# Patient Record
Sex: Male | Born: 1955 | Race: White | Hispanic: No | Marital: Married | Smoking: Current every day smoker
Health system: Southern US, Community
[De-identification: ages and names within clinical notes are randomized; demographics above are authoritative.]

## PROBLEM LIST (undated history)

## (undated) DIAGNOSIS — J449 Chronic obstructive pulmonary disease, unspecified: Secondary | ICD-10-CM

## (undated) DIAGNOSIS — R569 Unspecified convulsions: Secondary | ICD-10-CM

## (undated) HISTORY — PX: LUMBAR DISC SURGERY: SHX700

## (undated) HISTORY — DX: Unspecified convulsions: R56.9

## (undated) HISTORY — PX: TONSILLECTOMY: SUR1361

---

## 2014-10-20 ENCOUNTER — Other Ambulatory Visit: Payer: Self-pay | Admitting: Family Medicine

## 2014-10-20 ENCOUNTER — Telehealth: Payer: Self-pay | Admitting: Family Medicine

## 2014-10-20 DIAGNOSIS — G40802 Other epilepsy, not intractable, without status epilepticus: Secondary | ICD-10-CM

## 2014-10-20 MED ORDER — PHENYTOIN SODIUM EXTENDED 200 MG PO CAPS: 200.0000 mg | ORAL_CAPSULE | Freq: Two times a day (BID) | ORAL | Status: DC

## 2014-10-20 NOTE — Telephone Encounter (Signed)
Routed to Dr. Shah °

## 2014-10-20 NOTE — Telephone Encounter (Signed)
Scheduled annual cpe for November 10, 2014. He is requesting a refill on Phenytoin ER 100mg . Would like for a 30day supply to be sent to [facility] and then the remaining to be sent to [facility] delivery (p) [phone] option 3. Patient is completely out. Please call patient once this is complete [phone]

## 2014-10-20 NOTE — Telephone Encounter (Signed)
Prescription for phenytoin sent to pharmacy

## 2014-10-20 NOTE — Telephone Encounter (Signed)
Patient is going to call [facility] would not take the phone call. [employer] told patient that we had to call them and give them the new fax number

## 2014-10-20 NOTE — Telephone Encounter (Signed)
Cassandra,  Please have the pt contact his pharmacies  to send [place] a refill request.

## 2014-11-10 ENCOUNTER — Ambulatory Visit (INDEPENDENT_AMBULATORY_CARE_PROVIDER_SITE_OTHER): Payer: Managed Care, Other (non HMO) | Admitting: Family Medicine

## 2014-11-10 ENCOUNTER — Encounter: Payer: Self-pay | Admitting: Family Medicine

## 2014-11-10 VITALS — BP 112/75 | HR 65 | Temp 98.8°F | Resp 18 | Ht 66.0 in | Wt 154.4 lb

## 2014-11-10 DIAGNOSIS — G40409 Other generalized epilepsy and epileptic syndromes, not intractable, without status epilepticus: Secondary | ICD-10-CM | POA: Diagnosis not present

## 2014-11-10 DIAGNOSIS — Z0000 Encounter for general adult medical examination without abnormal findings: Secondary | ICD-10-CM

## 2014-11-10 DIAGNOSIS — R0683 Snoring: Secondary | ICD-10-CM | POA: Diagnosis not present

## 2014-11-10 MED ORDER — PHENYTOIN SODIUM EXTENDED 100 MG PO CAPS: 200.0000 mg | ORAL_CAPSULE | Freq: Two times a day (BID) | ORAL | Status: DC

## 2014-11-10 NOTE — Progress Notes (Signed)
Name: Nathan Mack   MRN: [id number]    DOB: [birth year]   Date:11/10/2014       Progress Note  Subjective  Chief Complaint  Chief Complaint  Patient presents with  . Annual Exam    CPE    HPI Pt. Is here for a CPE. Overall he is feeling well but his wife has noticed snoring.  Past Medical History  Diagnosis Date  . Seizures     Past Surgical History  Procedure Laterality Date  . Lumbar disc surgery      Family History  Problem Relation Age of Onset  . Cancer Mother     History   Social History  . Marital Status: Married    Spouse Name: N/A  . Number of Children: N/A  . Years of Education: N/A   Occupational History  . Not on file.   Social History Main Topics  . Smoking status: Current Every Day Smoker -- 1.00 packs/day    Types: Cigarettes  . Smokeless tobacco: Never Used  . Alcohol Use: No  . Drug Use: No  . Sexual Activity: Not on file   Other Topics Concern  . Not on file   Social History Narrative  . No narrative on file     Current outpatient prescriptions:  .  CALCIUM CARBONATE-VIT D-MIN PO, Take by mouth., Disp: , Rfl:  .  folic acid (FOLVITE) 366 MCG tablet, Take by mouth., Disp: , Rfl:  .  phenytoin (DILANTIN) 200 MG ER capsule, Take 1 capsule (200 mg total) by mouth 2 (two) times daily., Disp: 60 capsule, Rfl: 0  Allergies  Allergen Reactions  . Codeine   . Hydromorphone      Review of Systems  Constitutional: Negative for fever, chills, weight loss and malaise/fatigue.  HENT: Negative for congestion, ear pain and sore throat.   Eyes: Negative for blurred vision and pain.  Respiratory: Negative for cough, sputum production and shortness of breath.   Cardiovascular: Negative for chest pain, palpitations and leg swelling.  Gastrointestinal: Negative for heartburn, vomiting, diarrhea, constipation and blood in stool.  Genitourinary: Negative for dysuria, frequency and hematuria.  Musculoskeletal: Negative for myalgias, back pain and  joint pain.  Skin: Negative for itching and rash.  Neurological: Positive for seizures (seizure free on medication.). Negative for dizziness, tremors, loss of consciousness and headaches.  Endo/Heme/Allergies: Negative for polydipsia. Does not bruise/bleed easily.  Psychiatric/Behavioral: Negative for depression and memory loss. The patient is not nervous/anxious and does not have insomnia.       Objective  Filed Vitals:   11/10/14 1526  BP: 112/75  Pulse: 65  Temp: 98.8 F (37.1 C)  TempSrc: Oral  Resp: 18  Height: 5\' 6"  (1.676 m)  Weight: 154 lb 6.4 oz (70.035 kg)  SpO2: 97%    Physical Exam  Constitutional: He is oriented to person, place, and time and well-developed, well-nourished, and in no distress.  HENT:  Head: Normocephalic and atraumatic.  Right Ear: External ear normal.  Left Ear: External ear normal.  Mouth/Throat: No posterior oropharyngeal edema or posterior oropharyngeal erythema.  Right ear canal has cerumen build up.  Eyes:    Right pupil is not round, history of injury and surgery to the eye  Neck: Normal range of motion. Neck supple. No thyroid mass present.  Cardiovascular: Normal rate and regular rhythm.   Pulmonary/Chest: Effort normal and breath sounds normal.  Abdominal: Soft. Bowel sounds are normal.  Genitourinary:  Deferred on pt. Request.  Musculoskeletal: He  exhibits edema.       Right ankle: He exhibits swelling.       Left ankle: He exhibits swelling.  Neurological: He is alert and oriented to person, place, and time.  Skin: Skin is warm, dry and intact.  Psychiatric: Mood, memory, affect and judgment normal.  Nursing note and vitals reviewed.     Assessment & Plan 1. Encounter for annual health examination Patient reportedly had a colonoscopy in 2008 in [place]. No results or reports are available. He will be referred to gastroenterology for further evaluation and consideration for repeat colonoscopy  - CBC with  Differential - Comprehensive Metabolic Panel (CMET) - Lipid Profile - PSA - TSH - Hemoglobin A1c - Ambulatory referral to Gastroenterology - Vitamin D (25 hydroxy)   2. Seizure disorder, grand mal - Dilantin (Phenytoin) level, total - Folate - phenytoin (DILANTIN) 100 MG ER capsule; Take 2 capsules (200 mg total) by mouth 2 (two) times daily.  Dispense: 360 capsule; Refill: 1  3. Snoring - Home sleep test; Future   Myiah Petkus [facility] 11/10/2014 3:59 PM

## 2014-11-16 LAB — LIPID PANEL
Chol/HDL Ratio: 3.1 ratio units (ref 0.0–5.0)
Cholesterol, Total: 212 mg/dL — ABNORMAL HIGH (ref 100–199)
HDL: 68 mg/dL (ref >39)
LDL CALC: 126 mg/dL — AB (ref 0–99)
Triglycerides: 88 mg/dL (ref 0–149)
VLDL Cholesterol Cal: 18 mg/dL (ref 5–40)

## 2014-11-16 LAB — HEMOGLOBIN A1C
ESTIMATED AVERAGE GLUCOSE: 111 mg/dL
Hgb A1c MFr Bld: 5.5 % (ref 4.8–5.6)

## 2014-11-16 LAB — COMPREHENSIVE METABOLIC PANEL
ALBUMIN: 4.3 g/dL (ref 3.5–5.5)
ALK PHOS: 105 IU/L (ref 39–117)
ALT: 17 IU/L (ref 0–44)
AST: 18 IU/L (ref 0–40)
Albumin/Globulin Ratio: 1.4 (ref 1.1–2.5)
BUN/Creatinine Ratio: 19 (ref 9–20)
BUN: 16 mg/dL (ref 6–24)
Bilirubin Total: 0.4 mg/dL (ref 0.0–1.2)
CO2: 21 mmol/L (ref 18–29)
Calcium: 9.7 mg/dL (ref 8.7–10.2)
Chloride: 103 mmol/L (ref 97–108)
Creatinine, Ser: 0.85 mg/dL (ref 0.76–1.27)
GFR calc Af Amer: 110 mL/min/{1.73_m2} (ref >59)
GFR, EST NON AFRICAN AMERICAN: 95 mL/min/{1.73_m2} (ref >59)
GLOBULIN, TOTAL: 3 g/dL (ref 1.5–4.5)
GLUCOSE: 90 mg/dL (ref 65–99)
Potassium: 4.6 mmol/L (ref 3.5–5.2)
SODIUM: 142 mmol/L (ref 134–144)
Total Protein: 7.3 g/dL (ref 6.0–8.5)

## 2014-11-16 LAB — CBC WITH DIFFERENTIAL/PLATELET
BASOS: 1 %
Basophils Absolute: 0.1 10*3/uL (ref 0.0–0.2)
EOS (ABSOLUTE): 0.3 10*3/uL (ref 0.0–0.4)
Eos: 5 %
HEMOGLOBIN: 13.8 g/dL (ref 12.6–17.7)
Hematocrit: 39.8 % (ref 37.5–51.0)
Immature Grans (Abs): 0 10*3/uL (ref 0.0–0.1)
Immature Granulocytes: 0 %
LYMPHS ABS: 2.7 10*3/uL (ref 0.7–3.1)
Lymphs: 46 %
MCH: 33.3 pg — ABNORMAL HIGH (ref 26.6–33.0)
MCHC: 34.7 g/dL (ref 31.5–35.7)
MCV: 96 fL (ref 79–97)
Monocytes Absolute: 0.4 10*3/uL (ref 0.1–0.9)
Monocytes: 6 %
NEUTROS ABS: 2.5 10*3/uL (ref 1.4–7.0)
Neutrophils: 42 %
Platelets: 287 10*3/uL (ref 150–379)
RBC: 4.15 x10E6/uL (ref 4.14–5.80)
RDW: 14.4 % (ref 12.3–15.4)
WBC: 5.9 10*3/uL (ref 3.4–10.8)

## 2014-11-16 LAB — PSA: PROSTATE SPECIFIC AG, SERUM: 0.3 ng/mL (ref 0.0–4.0)

## 2014-11-16 LAB — PHENYTOIN LEVEL, TOTAL: Phenytoin (Dilantin), Serum: 22.5 ug/mL (ref 10.0–20.0)

## 2014-11-16 LAB — VITAMIN D 25 HYDROXY (VIT D DEFICIENCY, FRACTURES): Vit D, 25-Hydroxy: 42.6 ng/mL (ref 30.0–100.0)

## 2014-11-16 LAB — FOLATE: Folate: >20 ng/mL (ref >3.0)

## 2014-11-16 LAB — TSH: TSH: 1.67 u[IU]/mL (ref 0.450–4.500)

## 2014-11-17 ENCOUNTER — Other Ambulatory Visit: Payer: Self-pay | Admitting: Family Medicine

## 2014-11-17 DIAGNOSIS — G40409 Other generalized epilepsy and epileptic syndromes, not intractable, without status epilepticus: Secondary | ICD-10-CM

## 2014-11-18 LAB — PHENYTOIN LEVEL, TOTAL: Phenytoin (Dilantin), Serum: 24.1 ug/mL (ref 10.0–20.0)

## 2014-11-22 ENCOUNTER — Telehealth: Payer: Self-pay

## 2014-11-22 NOTE — Telephone Encounter (Signed)
We have tried to call patient several times with no voicemail, we will send letter for him to call us back

## 2014-11-25 ENCOUNTER — Telehealth: Payer: Self-pay | Admitting: Family Medicine

## 2014-11-25 DIAGNOSIS — G40409 Other generalized epilepsy and epileptic syndromes, not intractable, without status epilepticus: Secondary | ICD-10-CM

## 2014-11-25 NOTE — Telephone Encounter (Signed)
Lab orders for Phenytoin levels recheck has been ordered and patient has picked up orders and went to lab

## 2014-11-26 LAB — PHENYTOIN LEVEL, TOTAL: Phenytoin (Dilantin), Serum: 15.2 ug/mL (ref 10.0–20.0)

## 2015-02-10 ENCOUNTER — Ambulatory Visit: Payer: Managed Care, Other (non HMO) | Admitting: Family Medicine

## 2015-02-14 ENCOUNTER — Ambulatory Visit: Payer: Managed Care, Other (non HMO) | Admitting: Family Medicine

## 2015-02-16 ENCOUNTER — Ambulatory Visit (INDEPENDENT_AMBULATORY_CARE_PROVIDER_SITE_OTHER): Payer: Managed Care, Other (non HMO) | Admitting: Family Medicine

## 2015-02-16 ENCOUNTER — Encounter: Payer: Self-pay | Admitting: Family Medicine

## 2015-02-16 VITALS — BP 124/62 | HR 71 | Temp 98.7°F | Resp 16 | Ht 66.0 in | Wt 148.2 lb

## 2015-02-16 DIAGNOSIS — G40409 Other generalized epilepsy and epileptic syndromes, not intractable, without status epilepticus: Secondary | ICD-10-CM | POA: Diagnosis not present

## 2015-02-16 DIAGNOSIS — Z716 Tobacco abuse counseling: Secondary | ICD-10-CM | POA: Diagnosis not present

## 2015-02-16 DIAGNOSIS — Z23 Encounter for immunization: Secondary | ICD-10-CM | POA: Diagnosis not present

## 2015-02-16 NOTE — Progress Notes (Signed)
Name: Nathan Mack   MRN: [id number]    DOB: October 22, 1955   Date:02/16/2015       Progress Note  Subjective  Chief Complaint  Chief Complaint  Patient presents with  . Medication Refill    follow-up  . Seizures    her for re check on dilantin levels  . Nicotine Dependence    has cut back smoking to a half pack and has started walking    Nicotine Dependence Presents for follow-up visit. Preferred tobacco types include cigarettes. Preferred cigarette types include filtered. His urge triggers include company of smokers. Past treatments include nothing. Tatsuya is thinking about quitting (Patient has cut down from 1 pack a day to half pack a day).   Seizures Pt. Has history of epilepsy, last seizure was many years ago. Currently on Dilantin 200 mg twice daily. Last Dilantin level obtained in August 2016 was within normal range.  Past Medical History  Diagnosis Date  . Seizures [facility])     Past Surgical History  Procedure Laterality Date  . Lumbar disc surgery      Family History  Problem Relation Age of Onset  . Cancer Mother     Social History   Social History  . Marital Status: Married    Spouse Name: N/A  . Number of Children: N/A  . Years of Education: N/A   Occupational History  . Not on file.   Social History Main Topics  . Smoking status: Current Every Day Smoker -- 0.50 packs/day    Types: Cigarettes  . Smokeless tobacco: Never Used  . Alcohol Use: No  . Drug Use: No  . Sexual Activity: Not on file   Other Topics Concern  . Not on file   Social History Narrative    Current outpatient prescriptions:  .  CALCIUM CARBONATE-VIT D-MIN PO, Take by mouth., Disp: , Rfl:  .  folic acid (FOLVITE) 712 MCG tablet, Take by mouth., Disp: , Rfl:  .  phenytoin (DILANTIN) 100 MG ER capsule, Take 2 capsules (200 mg total) by mouth 2 (two) times daily., Disp: 360 capsule, Rfl: 1  Allergies  Allergen Reactions  . Codeine   . Hydromorphone     Review of Systems   Neurological: Negative for dizziness, seizures, loss of consciousness and headaches.    Objective  Filed Vitals:   02/16/15 1431  BP: 124/62  Pulse: 71  Temp: 98.7 F (37.1 C)  TempSrc: Oral  Resp: 16  Height: 5\' 6"  (1.676 m)  Weight: 148 lb 3.2 oz (67.223 kg)  SpO2: 98%    Physical Exam  Constitutional: He is oriented to person, place, and time and well-developed, well-nourished, and in no distress.  Cardiovascular: Normal rate, regular rhythm and normal heart sounds.   Pulmonary/Chest: Effort normal and breath sounds normal.  Neurological: He is alert and oriented to person, place, and time.  Nursing note and vitals reviewed.     Recent Results (from the past 2160 hour(s))  Dilantin (Phenytoin) level, total     Status: None   Collection Time: 11/25/14  2:03 PM  Result Value Ref Range   Phenytoin (Dilantin), Serum 15.2 10.0 - 20.0 ug/mL    Comment:                                 Neonatal:  Therapeutic 6.0 - 14.0                                 Detection Limit =  0.8                           <0.8 Indicates None Detected      Assessment & Plan  1. Needs flu shot  - Flu Vaccine QUAD 36+ mos PF IM (Fluarix & Fluzone Quad PF)  2. Seizure disorder, grand mal (HCC) Obtain phenytoin levels. Patient continues on present dosage of Dilantin. No seizures reported. - Dilantin (Phenytoin) level, total  3. Tobacco abuse counseling  Encouraged patient to stop smoking completely. He is not interested in any pharmacotherapy at present.    Media Pizzini [facility] 02/16/2015 2:59 PM

## 2015-04-28 LAB — PHENYTOIN LEVEL, TOTAL: PHENYTOIN (DILANTIN), SERUM: 11.5 ug/mL (ref 10.0–20.0)

## 2015-05-16 ENCOUNTER — Ambulatory Visit (INDEPENDENT_AMBULATORY_CARE_PROVIDER_SITE_OTHER): Payer: Managed Care, Other (non HMO) | Admitting: Family Medicine

## 2015-05-16 ENCOUNTER — Encounter: Payer: Self-pay | Admitting: Family Medicine

## 2015-05-16 VITALS — BP 124/69 | HR 72 | Temp 98.5°F | Resp 17 | Ht 66.0 in | Wt 148.9 lb

## 2015-05-16 DIAGNOSIS — G40409 Other generalized epilepsy and epileptic syndromes, not intractable, without status epilepticus: Secondary | ICD-10-CM

## 2015-05-16 MED ORDER — PHENYTOIN SODIUM EXTENDED 100 MG PO CAPS: 200.0000 mg | ORAL_CAPSULE | Freq: Two times a day (BID) | ORAL | Status: DC

## 2015-05-16 NOTE — Progress Notes (Signed)
Name: Nathan Mack   MRN: [id number]    DOB: August 01, 1955   Date:05/16/2015       Progress Note  Subjective  Chief Complaint  Chief Complaint  Patient presents with  . Follow-up    3 mo    HPI  Pt. Is here for follow up of Epilepsy (last seizure many years ago). He is on DIiantin 200 mg in AM and 100 mg at bedtime. Last Dilantin level was therapeutic at 11.5. Liver enzymes were normal.   Past Medical History  Diagnosis Date  . Seizures [facility])     Past Surgical History  Procedure Laterality Date  . Lumbar disc surgery      Family History  Problem Relation Age of Onset  . Cancer Mother     Social History   Social History  . Marital Status: Married    Spouse Name: N/A  . Number of Children: N/A  . Years of Education: N/A   Occupational History  . Not on file.   Social History Main Topics  . Smoking status: Current Every Day Smoker -- 0.50 packs/day    Types: Cigarettes  . Smokeless tobacco: Never Used  . Alcohol Use: No  . Drug Use: No  . Sexual Activity: Not on file   Other Topics Concern  . Not on file   Social History Narrative     Current outpatient prescriptions:  .  CALCIUM CARBONATE-VIT D-MIN PO, Take by mouth., Disp: , Rfl:  .  folic acid (FOLVITE) Q000111Q MCG tablet, Take by mouth., Disp: , Rfl:  .  phenytoin (DILANTIN) 100 MG ER capsule, Take 2 capsules (200 mg total) by mouth 2 (two) times daily., Disp: 360 capsule, Rfl: 1  Allergies  Allergen Reactions  . Codeine   . Hydromorphone      Review of Systems  Neurological: Negative for dizziness and tingling.    Objective  Filed Vitals:   05/16/15 1500  BP: 124/69  Pulse: 72  Temp: 98.5 F (36.9 C)  TempSrc: Oral  Resp: 17  Height: 5\' 6"  (1.676 m)  Weight: 148 lb 14.4 oz (67.541 kg)  SpO2: 99%    Physical Exam  Constitutional: He is oriented to person, place, and time and well-developed, well-nourished, and in no distress.  HENT:  Head: Normocephalic and atraumatic.   Cardiovascular: Normal rate and regular rhythm.   Pulmonary/Chest: Effort normal and breath sounds normal.  Abdominal: Soft. Bowel sounds are normal.  Neurological: He is alert and oriented to person, place, and time.  Nursing note and vitals reviewed.     Recent Results (from the past 2160 hour(s))  Dilantin (Phenytoin) level, total     Status: None   Collection Time: 04/27/15  2:03 PM  Result Value Ref Range   Phenytoin (Dilantin), Serum 11.5 10.0 - 20.0 ug/mL    Comment:                                 Neonatal:                                 Therapeutic 6.0 - 14.0                                 Detection Limit =  0.8                           <  0.8 Indicates None Detected      Assessment & Plan  1. Seizure disorder, grand mal ([place]) Dilantin level is therapeutic. Advised to resume prior dose of 100 mg 2 capsules twice daily. - phenytoin (DILANTIN) 100 MG ER capsule; Take 2 capsules (200 mg total) by mouth 2 (two) times daily.  Dispense: 360 capsule; Refill: 1 - Comprehensive Metabolic Panel (CMET)   Camden Knotek [facility] 05/16/2015 3:28 PM

## 2015-08-11 LAB — COMPREHENSIVE METABOLIC PANEL
A/G RATIO: 1.5 (ref 1.2–2.2)
ALBUMIN: 4.2 g/dL (ref 3.6–4.8)
ALT: 21 IU/L (ref 0–44)
AST: 20 IU/L (ref 0–40)
Alkaline Phosphatase: 97 IU/L (ref 39–117)
BILIRUBIN TOTAL: 0.3 mg/dL (ref 0.0–1.2)
BUN / CREAT RATIO: 22 (ref 10–24)
BUN: 19 mg/dL (ref 8–27)
CHLORIDE: 100 mmol/L (ref 96–106)
CO2: 23 mmol/L (ref 18–29)
Calcium: 9.2 mg/dL (ref 8.6–10.2)
Creatinine, Ser: 0.87 mg/dL (ref 0.76–1.27)
GFR calc non Af Amer: 94 mL/min/{1.73_m2} (ref >59)
GFR, EST AFRICAN AMERICAN: 108 mL/min/{1.73_m2} (ref >59)
Globulin, Total: 2.8 g/dL (ref 1.5–4.5)
Glucose: 94 mg/dL (ref 65–99)
POTASSIUM: 4.6 mmol/L (ref 3.5–5.2)
Sodium: 139 mmol/L (ref 134–144)
TOTAL PROTEIN: 7 g/dL (ref 6.0–8.5)

## 2015-08-15 ENCOUNTER — Encounter: Payer: Self-pay | Admitting: Family Medicine

## 2015-08-15 ENCOUNTER — Ambulatory Visit (INDEPENDENT_AMBULATORY_CARE_PROVIDER_SITE_OTHER): Payer: Managed Care, Other (non HMO) | Admitting: Family Medicine

## 2015-08-15 VITALS — BP 122/80 | HR 70 | Temp 98.7°F | Resp 16 | Ht 66.0 in | Wt 153.3 lb

## 2015-08-15 DIAGNOSIS — G40409 Other generalized epilepsy and epileptic syndromes, not intractable, without status epilepticus: Secondary | ICD-10-CM | POA: Diagnosis not present

## 2015-08-15 NOTE — Progress Notes (Signed)
Name: Nathan Mack   MRN: [id number]    DOB: August 11, 1955   Date:08/15/2015       Progress Note  Subjective  Chief Complaint  Chief Complaint  Patient presents with  . Follow-up    3 mo    HPI  Pt. Presets for follow up on seizure disorder, no recent seizures, Dilantin level was therapeutic in January 2017, on Dilantin 200 mg twice daily. Recent liver enzymes within normal range.    Past Medical History  Diagnosis Date  . Seizures [facility])     Past Surgical History  Procedure Laterality Date  . Lumbar disc surgery      Family History  Problem Relation Age of Onset  . Cancer Mother     Social History   Social History  . Marital Status: Married    Spouse Name: N/A  . Number of Children: N/A  . Years of Education: N/A   Occupational History  . Not on file.   Social History Main Topics  . Smoking status: Current Every Day Smoker -- 0.50 packs/day    Types: Cigarettes  . Smokeless tobacco: Never Used  . Alcohol Use: No  . Drug Use: No  . Sexual Activity: Not on file   Other Topics Concern  . Not on file   Social History Narrative     Current outpatient prescriptions:  .  CALCIUM CARBONATE-VIT D-MIN PO, Take by mouth., Disp: , Rfl:  .  folic acid (FOLVITE) Q000111Q MCG tablet, Take by mouth., Disp: , Rfl:  .  phenytoin (DILANTIN) 100 MG ER capsule, Take 2 capsules (200 mg total) by mouth 2 (two) times daily., Disp: 360 capsule, Rfl: 1  Allergies  Allergen Reactions  . Codeine   . Hydromorphone      Review of Systems  Gastrointestinal: Negative for nausea, vomiting and abdominal pain.  Neurological: Negative for dizziness, seizures and loss of consciousness.     Objective  Filed Vitals:   08/15/15 1514  BP: 122/80  Pulse: 70  Temp: 98.7 F (37.1 C)  TempSrc: Oral  Resp: 16  Height: 5\' 6"  (1.676 m)  Weight: 153 lb 4.8 oz (69.536 kg)  SpO2: 98%    Physical Exam  Constitutional: He is oriented to person, place, and time and well-developed,  well-nourished, and in no distress.  HENT:  Head: Normocephalic and atraumatic.  Cardiovascular: Normal rate and regular rhythm.   Pulmonary/Chest: Effort normal and breath sounds normal.  Neurological: He is alert and oriented to person, place, and time.  Psychiatric: Mood, memory, affect and judgment normal.  Nursing note and vitals reviewed.    Recent Results (from the past 2160 hour(s))  Comprehensive Metabolic Panel (CMET)     Status: None   Collection Time: 08/10/15  9:41 AM  Result Value Ref Range   Glucose 94 65 - 99 mg/dL   BUN 19 8 - 27 mg/dL   Creatinine, Ser 0.87 0.76 - 1.27 mg/dL   GFR calc non Af Amer 94 >59 mL/min/1.73   GFR calc Af Amer 108 >59 mL/min/1.73   BUN/Creatinine Ratio 22 10 - 24   Sodium 139 134 - 144 mmol/L   Potassium 4.6 3.5 - 5.2 mmol/L   Chloride 100 96 - 106 mmol/L   CO2 23 18 - 29 mmol/L   Calcium 9.2 8.6 - 10.2 mg/dL   Total Protein 7.0 6.0 - 8.5 g/dL   Albumin 4.2 3.6 - 4.8 g/dL   Globulin, Total 2.8 1.5 - 4.5 g/dL   Albumin/Globulin  Ratio 1.5 1.2 - 2.2   Bilirubin Total 0.3 0.0 - 1.2 mg/dL   Alkaline Phosphatase 97 39 - 117 IU/L   AST 20 0 - 40 IU/L   ALT 21 0 - 44 IU/L     Assessment & Plan  1. Seizure disorder, grand mal ([place]) Recheck Dilantin level, continue on present dosage of Dilantin. - Phenytoin level, total   Edwardine Deschepper [facility] 08/15/2015 3:32 PM

## 2015-11-14 ENCOUNTER — Encounter: Payer: Self-pay | Admitting: Family Medicine

## 2015-11-14 ENCOUNTER — Ambulatory Visit (INDEPENDENT_AMBULATORY_CARE_PROVIDER_SITE_OTHER): Payer: Managed Care, Other (non HMO) | Admitting: Family Medicine

## 2015-11-14 DIAGNOSIS — Z0189 Encounter for other specified special examinations: Secondary | ICD-10-CM | POA: Diagnosis not present

## 2015-11-14 DIAGNOSIS — Z0000 Encounter for general adult medical examination without abnormal findings: Secondary | ICD-10-CM | POA: Insufficient documentation

## 2015-11-14 NOTE — Progress Notes (Signed)
Name: Nathan Mack   MRN: [id number]    DOB: 02/13/1956   Date:11/14/2015       Progress Note  Subjective  Chief Complaint  Chief Complaint  Patient presents with  . Follow-up    HPI  Pt. Is here for an Annual Wellness Screening and lab work. He is doing well.   Past Medical History:  Diagnosis Date  . Seizures ([place])     Past Surgical History:  Procedure Laterality Date  . LUMBAR DISC SURGERY      Family History  Problem Relation Age of Onset  . Cancer Mother     Social History   Social History  . Marital status: Married    Spouse name: N/A  . Number of children: N/A  . Years of education: N/A   Occupational History  . Not on file.   Social History Main Topics  . Smoking status: Current Every Day Smoker    Packs/day: 0.50    Types: Cigarettes  . Smokeless tobacco: Never Used  . Alcohol use No  . Drug use: No  . Sexual activity: Not on file   Other Topics Concern  . Not on file   Social History Narrative  . No narrative on file     Current Outpatient Prescriptions:  .  CALCIUM CARBONATE-VIT D-MIN PO, Take by mouth., Disp: , Rfl:  .  folic acid (FOLVITE) Q000111Q MCG tablet, Take by mouth., Disp: , Rfl:  .  phenytoin (DILANTIN) 100 MG ER capsule, Take 2 capsules (200 mg total) by mouth 2 (two) times daily., Disp: 360 capsule, Rfl: 1  Allergies  Allergen Reactions  . Codeine   . Hydromorphone      Review of Systems  Constitutional: Negative for chills, fever and malaise/fatigue.  Eyes: Negative for blurred vision and double vision.  Respiratory: Negative for cough and shortness of breath.   Cardiovascular: Negative for chest pain.  Gastrointestinal: Negative for abdominal pain.  Musculoskeletal: Negative for back pain, joint pain and neck pain.  Neurological: Negative for headaches.  Psychiatric/Behavioral: Negative for depression. The patient is not nervous/anxious.      Objective  Vitals:   11/14/15 1532  BP: 110/70  Pulse: 69  Resp: 16   Temp: 98.5 F (36.9 C)  TempSrc: Oral  SpO2: (!) 69%  Weight: 155 lb (70.3 kg)  Height: 5\' 6"  (1.676 m)    Physical Exam  Constitutional: He is oriented to person, place, and time and well-developed, well-nourished, and in no distress.  HENT:  Head: Normocephalic and atraumatic.  Mouth/Throat: No posterior oropharyngeal erythema.  Bilateral cerumen impaction.  Eyes: Left pupil is not round.  Irregular left pupil,  Cardiovascular: Normal rate, regular rhythm, S1 normal and S2 normal.  Exam reveals no gallop.   No murmur heard. Pulmonary/Chest: Effort normal and breath sounds normal. No respiratory distress. He has no wheezes. He has no rhonchi.  Abdominal: Soft. Bowel sounds are normal. There is no tenderness.  Musculoskeletal:       Right ankle: He exhibits no swelling.       Left ankle: He exhibits no swelling.  Neurological: He is alert and oriented to person, place, and time.  Skin: Skin is warm, dry and intact.  Psychiatric: Mood, memory, affect and judgment normal.  Nursing note and vitals reviewed.      Assessment & Plan  1. Wellness examination  - Lipid panel - BASIC METABOLIC PANEL WITH GFR   Aalina Brege [facility] 11/14/2015 3:38 PM

## 2015-11-24 ENCOUNTER — Other Ambulatory Visit: Payer: Self-pay | Admitting: Family Medicine

## 2015-11-24 DIAGNOSIS — G40409 Other generalized epilepsy and epileptic syndromes, not intractable, without status epilepticus: Secondary | ICD-10-CM

## 2016-02-15 ENCOUNTER — Ambulatory Visit (INDEPENDENT_AMBULATORY_CARE_PROVIDER_SITE_OTHER): Payer: Managed Care, Other (non HMO) | Admitting: Family Medicine

## 2016-02-15 ENCOUNTER — Encounter: Payer: Self-pay | Admitting: Family Medicine

## 2016-02-15 VITALS — BP 112/70 | HR 83 | Temp 98.5°F | Resp 16 | Ht 66.0 in | Wt 149.5 lb

## 2016-02-15 DIAGNOSIS — Z1211 Encounter for screening for malignant neoplasm of colon: Secondary | ICD-10-CM

## 2016-02-15 DIAGNOSIS — Z0000 Encounter for general adult medical examination without abnormal findings: Secondary | ICD-10-CM | POA: Diagnosis not present

## 2016-02-15 LAB — CBC WITH DIFFERENTIAL/PLATELET
Basophils Absolute: 0 cells/uL (ref 0–200)
Basophils Relative: 0 %
EOS ABS: 225 {cells}/uL (ref 15–500)
Eosinophils Relative: 3 %
HEMATOCRIT: 39.6 % (ref 38.5–50.0)
HEMOGLOBIN: 13.3 g/dL (ref 13.2–17.1)
Lymphocytes Relative: 39 %
Lymphs Abs: 2925 cells/uL (ref 850–3900)
MCH: 33.3 pg — AB (ref 27.0–33.0)
MCHC: 33.6 g/dL (ref 32.0–36.0)
MCV: 99 fL (ref 80.0–100.0)
MPV: 9.2 fL (ref 7.5–12.5)
Monocytes Absolute: 525 cells/uL (ref 200–950)
Monocytes Relative: 7 %
Neutro Abs: 3825 cells/uL (ref 1500–7800)
Neutrophils Relative %: 51 %
Platelets: 309 10*3/uL (ref 140–400)
RBC: 4 MIL/uL — ABNORMAL LOW (ref 4.20–5.80)
RDW: 13.6 % (ref 11.0–15.0)
WBC: 7.5 10*3/uL (ref 3.8–10.8)

## 2016-02-15 LAB — COMPLETE METABOLIC PANEL WITH GFR
ALBUMIN: 4.1 g/dL (ref 3.6–5.1)
ALK PHOS: 92 U/L (ref 40–115)
ALT: 21 U/L (ref 9–46)
AST: 24 U/L (ref 10–35)
BILIRUBIN TOTAL: 0.4 mg/dL (ref 0.2–1.2)
BUN: 19 mg/dL (ref 7–25)
CALCIUM: 9.6 mg/dL (ref 8.6–10.3)
CO2: 24 mmol/L (ref 20–31)
Chloride: 108 mmol/L (ref 98–110)
Creat: 1.06 mg/dL (ref 0.70–1.25)
GFR, EST NON AFRICAN AMERICAN: 76 mL/min (ref >=60)
GFR, Est African American: 88 mL/min (ref >=60)
GLUCOSE: 86 mg/dL (ref 65–99)
POTASSIUM: 5.1 mmol/L (ref 3.5–5.3)
Sodium: 140 mmol/L (ref 135–146)
TOTAL PROTEIN: 7 g/dL (ref 6.1–8.1)

## 2016-02-15 LAB — TSH: TSH: 1.07 m[IU]/L (ref 0.40–4.50)

## 2016-02-15 LAB — LIPID PANEL
CHOL/HDL RATIO: 2.9 ratio (ref <=5.0)
CHOLESTEROL: 184 mg/dL (ref 125–200)
HDL: 63 mg/dL (ref >=40)
LDL Cholesterol: 103 mg/dL (ref <130)
Triglycerides: 91 mg/dL (ref <150)
VLDL: 18 mg/dL (ref <30)

## 2016-02-15 LAB — PSA: PSA: 0.7 ng/mL (ref <=4.0)

## 2016-02-15 NOTE — Progress Notes (Signed)
Name: Nathan Mack   MRN: [id number]    DOB: 1955-07-09   Date:02/15/2016       Progress Note  Subjective  Chief Complaint  Chief Complaint  Patient presents with  . Annual Exam    HPI  Pt. Presents for Annual Physical Exam and lab work for insurance. He feels well. Does not remember when his last colonoscopy was done.  Past Medical History:  Diagnosis Date  . Seizures ([place])     Past Surgical History:  Procedure Laterality Date  . LUMBAR DISC SURGERY      Family History  Problem Relation Age of Onset  . Cancer Mother     Social History   Social History  . Marital status: Married    Spouse name: N/A  . Number of children: N/A  . Years of education: N/A   Occupational History  . Not on file.   Social History Main Topics  . Smoking status: Current Every Day Smoker    Packs/day: 0.50    Types: Cigarettes  . Smokeless tobacco: Never Used  . Alcohol use No  . Drug use: No  . Sexual activity: Not on file   Other Topics Concern  . Not on file   Social History Narrative  . No narrative on file     Current Outpatient Prescriptions:  .  CALCIUM CARBONATE-VIT D-MIN PO, Take by mouth., Disp: , Rfl:  .  folic acid (FOLVITE) Q000111Q MCG tablet, Take by mouth., Disp: , Rfl:  .  phenytoin (DILANTIN) 100 MG ER capsule, TAKE 2 CAPSULES BY MOUTH TWICE DAILY, Disp: 360 capsule, Rfl: 0  Allergies  Allergen Reactions  . Codeine   . Hydromorphone      Review of Systems  Constitutional: Negative for chills, fever and malaise/fatigue.  Eyes: Negative for blurred vision and double vision.  Respiratory: Negative for cough, sputum production and shortness of breath.   Cardiovascular: Negative for chest pain and leg swelling.  Gastrointestinal: Negative for abdominal pain, constipation, diarrhea, melena, nausea and vomiting.  Genitourinary: Negative for dysuria and hematuria.  Musculoskeletal: Negative for back pain, joint pain and myalgias.  Neurological: Negative for  dizziness and headaches.  Psychiatric/Behavioral: Negative for depression. The patient is not nervous/anxious and does not have insomnia.    Objective  Vitals:   02/15/16 1353  BP: 112/70  Pulse: 83  Resp: 16  Temp: 98.5 F (36.9 C)  TempSrc: Oral  SpO2: 99%  Weight: 149 lb 8 oz (67.8 kg)  Height: 5\' 6"  (1.676 m)    Physical Exam  Constitutional: He is oriented to person, place, and time and well-developed, well-nourished, and in no distress.  HENT:  Head: Normocephalic and atraumatic.  Cardiovascular: Normal rate, regular rhythm, S1 normal, S2 normal and normal heart sounds.   No murmur heard. Pulmonary/Chest: Effort normal and breath sounds normal. He has no wheezes.  Abdominal: Soft. Bowel sounds are normal. There is no tenderness.  Genitourinary: Prostate normal.  Genitourinary Comments: Prostate minimally enlarged, non tender to palpation.  Musculoskeletal:       Right ankle: He exhibits swelling.       Left ankle: He exhibits swelling.  Neurological: He is alert and oriented to person, place, and time.  Skin: Skin is warm and dry.  Psychiatric: Mood, memory, affect and judgment normal.  Nursing note and vitals reviewed.      Assessment & Plan  1. Annual physical exam Obtain age-appropriate laboratory screenings. - CBC with Differential - COMPLETE METABOLIC PANEL WITH GFR -  Lipid Profile - PSA - TSH - Vitamin D (25 hydroxy)  2. Colon cancer screening Referral to GI for colonoscopy - Ambulatory referral to Gastroenterology   Dossie Der [facility] 02/15/2016 2:24 PM

## 2016-02-16 LAB — VITAMIN D 25 HYDROXY (VIT D DEFICIENCY, FRACTURES): Vit D, 25-Hydroxy: 58 ng/mL (ref 30–100)

## 2016-05-30 ENCOUNTER — Telehealth: Payer: Self-pay | Admitting: Family Medicine

## 2016-05-30 DIAGNOSIS — G40409 Other generalized epilepsy and epileptic syndromes, not intractable, without status epilepticus: Secondary | ICD-10-CM

## 2016-05-30 MED ORDER — PHENYTOIN SODIUM EXTENDED 100 MG PO CAPS: 200.0000 mg | ORAL_CAPSULE | Freq: Two times a day (BID) | ORAL | 0 refills | Status: DC

## 2016-05-30 NOTE — Telephone Encounter (Signed)
Requesting refill on phenytoin 100mg  ER. Please send to [facility]. ot is completely out and will need one for tomorrow.

## 2016-05-30 NOTE — Telephone Encounter (Signed)
Prescription for Phenytoin 100 mg ER is sent to pharmacy.

## 2016-05-30 NOTE — Telephone Encounter (Signed)
Pt verbally informed. °

## 2016-06-06 ENCOUNTER — Other Ambulatory Visit: Payer: Self-pay | Admitting: Family Medicine

## 2016-06-06 DIAGNOSIS — Z1211 Encounter for screening for malignant neoplasm of colon: Secondary | ICD-10-CM

## 2016-06-19 NOTE — Progress Notes (Signed)
Cologuard order has been sent

## 2016-07-10 ENCOUNTER — Telehealth: Payer: Self-pay | Admitting: Family Medicine

## 2016-07-10 NOTE — Telephone Encounter (Signed)
FYI: Nathan Mack with [employer] states that he is not able to locate the patient. Tried delivering the colonoscopy kit and it came back to them. They tried contacted patient via phone and it leads to a [employer] dealership and they told Nathan Mack that he is not a employee there. We then verified the address and realized that he did not have the full address, therefore he will try sending out another kit but stated that he is still not able to contact the patient via telephone.  [place]

## 2016-07-10 NOTE — Telephone Encounter (Signed)
We need to contact this patient to get his complete and accurate address on file for communication purposes.

## 2016-07-11 NOTE — Telephone Encounter (Signed)
Tried contacting patient and the numbers that we have on file is not correct. The number that we have on file is for a [employer].

## 2016-07-22 ENCOUNTER — Other Ambulatory Visit: Payer: Self-pay | Admitting: Family Medicine

## 2016-07-22 DIAGNOSIS — G40409 Other generalized epilepsy and epileptic syndromes, not intractable, without status epilepticus: Secondary | ICD-10-CM

## 2016-08-13 ENCOUNTER — Ambulatory Visit (INDEPENDENT_AMBULATORY_CARE_PROVIDER_SITE_OTHER): Payer: Managed Care, Other (non HMO) | Admitting: Family Medicine

## 2016-08-13 ENCOUNTER — Encounter: Payer: Self-pay | Admitting: Family Medicine

## 2016-08-13 DIAGNOSIS — G40409 Other generalized epilepsy and epileptic syndromes, not intractable, without status epilepticus: Secondary | ICD-10-CM | POA: Diagnosis not present

## 2016-08-13 MED ORDER — PHENYTOIN SODIUM EXTENDED 100 MG PO CAPS: 200.0000 mg | ORAL_CAPSULE | Freq: Two times a day (BID) | ORAL | 1 refills | Status: DC

## 2016-08-13 NOTE — Progress Notes (Signed)
Name: Nathan Mack   MRN: [id number]    DOB: 1956-03-08   Date:08/13/2016       Progress Note  Subjective  Chief Complaint  Chief Complaint  Patient presents with  . Medication Refill    HPI  Seizure Disorder: Patient presents for follow up of seizure disorder, he takes Dilantin 200 mg BID, last known seizure was many years ago, levels were checked in January 2017 which were normal.    Past Medical History:  Diagnosis Date  . Seizures ([place])     Past Surgical History:  Procedure Laterality Date  . LUMBAR DISC SURGERY      Family History  Problem Relation Age of Onset  . Cancer Mother     Social History   Social History  . Marital status: Married    Spouse name: N/A  . Number of children: N/A  . Years of education: N/A   Occupational History  . Not on file.   Social History Main Topics  . Smoking status: Current Every Day Smoker    Packs/day: 0.50    Types: Cigarettes  . Smokeless tobacco: Never Used  . Alcohol use No  . Drug use: No  . Sexual activity: Not on file   Other Topics Concern  . Not on file   Social History Narrative  . No narrative on file     Current Outpatient Prescriptions:  .  CALCIUM CARBONATE-VIT D-MIN PO, Take by mouth., Disp: , Rfl:  .  folic acid (FOLVITE) 147 MCG tablet, Take by mouth., Disp: , Rfl:  .  phenytoin (DILANTIN) 100 MG ER capsule, Take 2 capsules (200 mg total) by mouth 2 (two) times daily., Disp: 360 capsule, Rfl: 0  Allergies  Allergen Reactions  . Codeine   . Hydromorphone      ROS  Please see history of present illness for complete discussion of ROS  Objective  Vitals:   08/13/16 1328  BP: 118/63  Pulse: 76  Resp: 16  Temp: 98 F (36.7 C)  TempSrc: Oral  SpO2: 96%  Weight: 158 lb 3.2 oz (71.8 kg)  Height: 5\' 6"  (1.676 m)    Physical Exam  Constitutional: He is oriented to person, place, and time and well-developed, well-nourished, and in no distress.  HENT:  Head: Normocephalic and  atraumatic.  Cardiovascular: Normal rate, regular rhythm and normal heart sounds.   No murmur heard. Pulmonary/Chest: Effort normal and breath sounds normal. He has no wheezes.  Neurological: He is alert and oriented to person, place, and time.  Psychiatric: Mood, memory, affect and judgment normal.  Nursing note and vitals reviewed.      Assessment & Plan  1. Seizure disorder, grand mal ([place]) Seizure free on medication, continue on Dilantin and obtain levels - phenytoin (DILANTIN) 100 MG ER capsule; Take 2 capsules (200 mg total) by mouth 2 (two) times daily.  Dispense: 360 capsule; Refill: 1 - Phenytoin level, total   Shalayne Leach Asad A. [facility] 08/13/2016 1:49 PM

## 2016-08-14 LAB — PHENYTOIN LEVEL, TOTAL: Phenytoin Lvl: 19.4 ug/mL (ref 10.0–20.0)

## 2017-02-17 ENCOUNTER — Encounter: Payer: Managed Care, Other (non HMO) | Admitting: Family Medicine

## 2017-02-18 ENCOUNTER — Ambulatory Visit (INDEPENDENT_AMBULATORY_CARE_PROVIDER_SITE_OTHER): Payer: Managed Care, Other (non HMO) | Admitting: Family Medicine

## 2017-02-18 DIAGNOSIS — G40409 Other generalized epilepsy and epileptic syndromes, not intractable, without status epilepticus: Secondary | ICD-10-CM | POA: Diagnosis not present

## 2017-02-18 MED ORDER — PHENYTOIN SODIUM EXTENDED 100 MG PO CAPS: 200.0000 mg | ORAL_CAPSULE | Freq: Two times a day (BID) | ORAL | 0 refills | Status: DC

## 2017-02-18 MED ORDER — PHENYTOIN SODIUM EXTENDED 100 MG PO CAPS: 200.0000 mg | ORAL_CAPSULE | Freq: Two times a day (BID) | ORAL | 1 refills | Status: DC

## 2017-02-18 NOTE — Progress Notes (Signed)
Name: Nathan Mack   MRN: [id number]    DOB: [birth year]   Date:02/18/2017       Progress Note  Subjective  Chief Complaint  Chief Complaint  Patient presents with  . Medication Refill    dilantin    HPI  Seizure Disorder:  Pt. has history of seizure disorder, he was diagnosed as a young child, he takes Dilantin 200 mg two capsules twice daily to prevent recurrence. He is otherwise doing well, no side effects from DiIalntin, has remained seizure free while on medication. He is due for repeat check on Dilantin levels.     Past Medical History:  Diagnosis Date  . Seizures ([place])     Past Surgical History:  Procedure Laterality Date  . LUMBAR DISC SURGERY      Family History  Problem Relation Age of Onset  . Cancer Mother     Social History   Social History  . Marital status: Married    Spouse name: N/A  . Number of children: N/A  . Years of education: N/A   Occupational History  . Not on file.   Social History Main Topics  . Smoking status: Current Every Day Smoker    Packs/day: 0.50    Types: Cigarettes  . Smokeless tobacco: Never Used  . Alcohol use No  . Drug use: No  . Sexual activity: Not on file   Other Topics Concern  . Not on file   Social History Narrative  . No narrative on file     Current Outpatient Prescriptions:  .  CALCIUM CARBONATE-VIT D-MIN PO, Take by mouth., Disp: , Rfl:  .  folic acid (FOLVITE) 419 MCG tablet, Take by mouth., Disp: , Rfl:  .  phenytoin (DILANTIN) 100 MG ER capsule, Take 2 capsules (200 mg total) by mouth 2 (two) times daily., Disp: 360 capsule, Rfl: 1  Allergies  Allergen Reactions  . Codeine   . Hydromorphone      Review of Systems  Neurological: Negative for dizziness, seizures and headaches.      Objective  Vitals:   02/18/17 1422  BP: 118/62  Pulse: 77  Resp: 16  Temp: 98.1 F (36.7 C)  TempSrc: Oral  SpO2: 97%  Weight: 150 lb 3.2 oz (68.1 kg)  Height: 5\' 6"  (1.676 m)    Physical Exam   Constitutional: He is oriented to person, place, and time and well-developed, well-nourished, and in no distress.  HENT:  Head: Normocephalic and atraumatic.  Cardiovascular: Normal rate, regular rhythm and normal heart sounds.   No murmur heard. Pulmonary/Chest: Effort normal and breath sounds normal. He has no wheezes.  Neurological: He is alert and oriented to person, place, and time.  Psychiatric: Mood, memory, affect and judgment normal.  Nursing note and vitals reviewed.       Assessment & Plan  1. Seizure disorder, grand mal ([place]) No recurrence of seizures, obtain Phenytoin levels. Refills provided.  - phenytoin (DILANTIN) 100 MG ER capsule; Take 2 capsules (200 mg total) by mouth 2 (two) times daily.  Dispense: 360 capsule; Refill: 1 - phenytoin (DILANTIN) 100 MG ER capsule; Take 2 capsules (200 mg total) by mouth 2 (two) times daily.  Dispense: 360 capsule; Refill: 0 - Dilantin (Phenytoin) level, total   Jadea Shiffer [facility]ad A[facility] 02/18/2017 2:25 PM

## 2017-02-19 LAB — PHENYTOIN LEVEL, TOTAL: PHENYTOIN, TOTAL: 16.9 mg/L (ref 10.0–20.0)

## 2017-03-06 ENCOUNTER — Encounter: Payer: Self-pay | Admitting: Family Medicine

## 2017-03-06 ENCOUNTER — Ambulatory Visit (INDEPENDENT_AMBULATORY_CARE_PROVIDER_SITE_OTHER): Payer: Managed Care, Other (non HMO) | Admitting: Family Medicine

## 2017-03-06 VITALS — BP 110/62 | HR 91 | Temp 98.2°F | Resp 16 | Ht 66.0 in | Wt 150.4 lb

## 2017-03-06 DIAGNOSIS — Z0000 Encounter for general adult medical examination without abnormal findings: Secondary | ICD-10-CM

## 2017-03-06 DIAGNOSIS — L240 Irritant contact dermatitis due to detergents: Secondary | ICD-10-CM

## 2017-03-06 DIAGNOSIS — Z1211 Encounter for screening for malignant neoplasm of colon: Secondary | ICD-10-CM

## 2017-03-06 MED ORDER — TRIAMCINOLONE ACETONIDE 0.025 % EX OINT: 1.0000 "application " | TOPICAL_OINTMENT | Freq: Two times a day (BID) | CUTANEOUS | 0 refills | Status: DC

## 2017-03-06 NOTE — Progress Notes (Signed)
Name: Nathan Mack   MRN: [id number]    DOB: 1955/10/27   Date:03/06/2017       Progress Note  Subjective  Chief Complaint  Chief Complaint  Patient presents with  . Annual Exam    HPI  Pt. Presents for complete Physical Exam.  He is due for colon cancer screening, will order Cologuard.  He is due for Hepatitis C screening.    Past Medical History:  Diagnosis Date  . Seizures ([place])     Past Surgical History:  Procedure Laterality Date  . LUMBAR DISC SURGERY      Family History  Problem Relation Age of Onset  . Cancer Mother     Social History   Socioeconomic History  . Marital status: Married    Spouse name: Not on file  . Number of children: Not on file  . Years of education: Not on file  . Highest education level: Not on file  Social Needs  . Financial resource strain: Not on file  . Food insecurity - worry: Not on file  . Food insecurity - inability: Not on file  . Transportation needs - medical: Not on file  . Transportation needs - non-medical: Not on file  Occupational History  . Not on file  Tobacco Use  . Smoking status: Current Every Day Smoker    Packs/day: 0.50    Types: Cigarettes  . Smokeless tobacco: Never Used  Substance and Sexual Activity  . Alcohol use: No    Alcohol/week: 0.0 oz  . Drug use: No  . Sexual activity: Yes  Other Topics Concern  . Not on file  Social History Narrative  . Not on file     Current Outpatient Medications:  .  CALCIUM CARBONATE-VIT D-MIN PO, Take by mouth., Disp: , Rfl:  .  folic acid (FOLVITE) 751 MCG tablet, Take by mouth., Disp: , Rfl:  .  phenytoin (DILANTIN) 100 MG ER capsule, Take 2 capsules (200 mg total) by mouth 2 (two) times daily., Disp: 360 capsule, Rfl: 1 .  phenytoin (DILANTIN) 100 MG ER capsule, Take 2 capsules (200 mg total) by mouth 2 (two) times daily., Disp: 360 capsule, Rfl: 0  Allergies  Allergen Reactions  . Codeine   . Hydromorphone      Review of Systems  Constitutional:  Negative for chills, fever and malaise/fatigue.  HENT: Negative for congestion, ear pain and sore throat.   Eyes: Negative for blurred vision and double vision.  Respiratory: Negative for cough, shortness of breath and wheezing.   Cardiovascular: Negative for chest pain, palpitations and leg swelling.  Gastrointestinal: Negative for abdominal pain, blood in stool, constipation, diarrhea, nausea and vomiting.  Genitourinary: Negative for dysuria and hematuria.  Musculoskeletal: Negative for back pain and neck pain.  Skin: Positive for rash.       Itching lesion on the anal area,  Neurological: Negative for dizziness and headaches.  Psychiatric/Behavioral: Negative for depression. The patient is not nervous/anxious and does not have insomnia.      Objective  Vitals:   03/06/17 1359  BP: 110/62  Pulse: 91  Resp: 16  Temp: 98.2 F (36.8 C)  TempSrc: Oral  SpO2: 96%  Weight: 150 lb 6.4 oz (68.2 kg)  Height: 5\' 6"  (1.676 m)    Physical Exam  Constitutional: He is oriented to person, place, and time and well-developed, well-nourished, and in no distress.  HENT:  Head: Normocephalic and atraumatic.  Mouth/Throat: No posterior oropharyngeal erythema.  Cerumen impacted in both  ear canals  Cardiovascular: Normal rate, regular rhythm, S1 normal, S2 normal and normal heart sounds.  No murmur heard. Pulmonary/Chest: Effort normal and breath sounds normal. He has no wheezes.  Abdominal: Soft. Bowel sounds are normal. There is no tenderness.  Genitourinary: Rectum normal. Prostate is enlarged. Prostate is not tender.  Musculoskeletal: He exhibits no edema.  Neurological: He is alert and oriented to person, place, and time.  Skin: Skin is warm and dry. Rash noted. Rash is not maculopapular.  maculo-papular pruritic erythematous rash around the inner folds of buttocks, c/w dermatitis.  Psychiatric: Mood, memory, affect and judgment normal.  Nursing note and vitals  reviewed.      Recent Results (from the past 2160 hour(s))  Dilantin (Phenytoin) level, total     Status: None   Collection Time: 02/18/17  2:42 PM  Result Value Ref Range   Phenytoin, Total 16.9 10.0 - 20.0 mg/L     Assessment & Plan  1. Annual physical exam Obtain age-appropriate laboratory screening - CBC with Differential/Platelet - COMPLETE METABOLIC PANEL WITH GFR - Lipid panel - VITAMIN D 25 Hydroxy (Vit-D Deficiency, Fractures) - TSH - Hepatitis C antibody - PSA  2. Screening for colon cancer  - Cologuard  3. Irritant contact dermatitis due to detergent Likely because of change in detergent versus other chemical, will start on Kenalog ointment for treatment - triamcinolone (KENALOG) 0.025 % ointment; Apply 1 application 2 (two) times daily topically.  Dispense: 30 g; Refill: 0  Nathan Mack Asad A[facility] 03/06/2017 2:15 PM

## 2017-03-07 LAB — CBC WITH DIFFERENTIAL/PLATELET
BASOS PCT: 1 %
Basophils Absolute: 73 cells/uL (ref 0–200)
Eosinophils Absolute: 277 cells/uL (ref 15–500)
Eosinophils Relative: 3.8 %
HCT: 36.2 % — ABNORMAL LOW (ref 38.5–50.0)
Hemoglobin: 12.8 g/dL — ABNORMAL LOW (ref 13.2–17.1)
Lymphs Abs: 2927 cells/uL (ref 850–3900)
MCH: 33.2 pg — ABNORMAL HIGH (ref 27.0–33.0)
MCHC: 35.4 g/dL (ref 32.0–36.0)
MCV: 94 fL (ref 80.0–100.0)
MONOS PCT: 6.4 %
MPV: 9.6 fL (ref 7.5–12.5)
NEUTROS ABS: 3555 {cells}/uL (ref 1500–7800)
Neutrophils Relative %: 48.7 %
Platelets: 294 10*3/uL (ref 140–400)
RBC: 3.85 10*6/uL — AB (ref 4.20–5.80)
RDW: 12.7 % (ref 11.0–15.0)
TOTAL LYMPHOCYTE: 40.1 %
WBC mixed population: 467 cells/uL (ref 200–950)
WBC: 7.3 10*3/uL (ref 3.8–10.8)

## 2017-03-07 LAB — COMPLETE METABOLIC PANEL WITH GFR
AG RATIO: 1.5 (calc) (ref 1.0–2.5)
ALKALINE PHOSPHATASE (APISO): 107 U/L (ref 40–115)
ALT: 15 U/L (ref 9–46)
AST: 18 U/L (ref 10–35)
Albumin: 4.4 g/dL (ref 3.6–5.1)
BILIRUBIN TOTAL: 0.4 mg/dL (ref 0.2–1.2)
BUN: 15 mg/dL (ref 7–25)
CHLORIDE: 105 mmol/L (ref 98–110)
CO2: 26 mmol/L (ref 20–32)
Calcium: 9.2 mg/dL (ref 8.6–10.3)
Creat: 0.86 mg/dL (ref 0.70–1.25)
GFR, Est African American: 108 mL/min/{1.73_m2} (ref >=60)
GFR, Est Non African American: 94 mL/min/{1.73_m2} (ref >=60)
GLOBULIN: 2.9 g/dL (ref 1.9–3.7)
Glucose, Bld: 85 mg/dL (ref 65–99)
POTASSIUM: 4.1 mmol/L (ref 3.5–5.3)
SODIUM: 138 mmol/L (ref 135–146)
Total Protein: 7.3 g/dL (ref 6.1–8.1)

## 2017-03-07 LAB — LIPID PANEL
CHOLESTEROL: 195 mg/dL (ref <200)
HDL: 60 mg/dL (ref >40)
LDL Cholesterol (Calc): 109 mg/dL (calc) — ABNORMAL HIGH
NON-HDL CHOLESTEROL (CALC): 135 mg/dL — AB (ref <130)
Total CHOL/HDL Ratio: 3.3 (calc) (ref <5.0)
Triglycerides: 138 mg/dL (ref <150)

## 2017-03-07 LAB — HEPATITIS C ANTIBODY
Hepatitis C Ab: NONREACTIVE
SIGNAL TO CUT-OFF: 0.05 (ref <1.00)

## 2017-03-07 LAB — TSH: TSH: 1.83 mIU/L (ref 0.40–4.50)

## 2017-03-07 LAB — VITAMIN D 25 HYDROXY (VIT D DEFICIENCY, FRACTURES): Vit D, 25-Hydroxy: 54 ng/mL (ref 30–100)

## 2017-03-07 LAB — PSA: PSA: 0.8 ng/mL (ref <=4.0)

## 2017-06-06 ENCOUNTER — Ambulatory Visit (INDEPENDENT_AMBULATORY_CARE_PROVIDER_SITE_OTHER): Payer: Managed Care, Other (non HMO) | Admitting: Family Medicine

## 2017-06-06 ENCOUNTER — Encounter: Payer: Self-pay | Admitting: Family Medicine

## 2017-06-06 VITALS — BP 112/64 | HR 63 | Temp 98.1°F | Resp 18 | Ht 66.0 in | Wt 149.4 lb

## 2017-06-06 DIAGNOSIS — J320 Chronic maxillary sinusitis: Secondary | ICD-10-CM | POA: Diagnosis not present

## 2017-06-06 MED ORDER — AMOXICILLIN-POT CLAVULANATE 875-125 MG PO TABS: 1.0000 | ORAL_TABLET | Freq: Two times a day (BID) | ORAL | 0 refills | Status: DC

## 2017-06-06 NOTE — Progress Notes (Signed)
Name: Nathan Mack   MRN: [id number]    DOB: 1956/03/13   Date:06/06/2017       Progress Note  Subjective  Chief Complaint  Chief Complaint  Patient presents with  . Follow-up    3 months    Sinusitis  This is a chronic problem. The current episode started more than 1 month ago (2+ months ago.). There has been no fever. Associated symptoms include congestion, coughing, headaches and sinus pressure. Pertinent negatives include no chills or sore throat. Past treatments include oral decongestants (Alka Seltzer).      Past Medical History:  Diagnosis Date  . Seizures ([place])     Past Surgical History:  Procedure Laterality Date  . LUMBAR DISC SURGERY      Family History  Problem Relation Age of Onset  . Cancer Mother        colon    Social History   Socioeconomic History  . Marital status: Married    Spouse name: Not on file  . Number of children: Not on file  . Years of education: Not on file  . Highest education level: Not on file  Social Needs  . Financial resource strain: Not on file  . Food insecurity - worry: Not on file  . Food insecurity - inability: Not on file  . Transportation needs - medical: Not on file  . Transportation needs - non-medical: Not on file  Occupational History  . Not on file  Tobacco Use  . Smoking status: Current Every Day Smoker    Packs/day: 0.50    Types: Cigarettes  . Smokeless tobacco: Never Used  Substance and Sexual Activity  . Alcohol use: No    Alcohol/week: 0.0 oz  . Drug use: No  . Sexual activity: Yes  Other Topics Concern  . Not on file  Social History Narrative  . Not on file     Current Outpatient Medications:  .  CALCIUM CARBONATE-VIT D-MIN PO, Take by mouth., Disp: , Rfl:  .  folic acid (FOLVITE) 673 MCG tablet, Take 800 mcg by mouth daily. , Disp: , Rfl:  .  phenytoin (DILANTIN) 100 MG ER capsule, Take 2 capsules (200 mg total) by mouth 2 (two) times daily., Disp: 360 capsule, Rfl: 1 .  phenytoin (DILANTIN)  100 MG ER capsule, Take 2 capsules (200 mg total) by mouth 2 (two) times daily., Disp: 360 capsule, Rfl: 0 .  triamcinolone (KENALOG) 0.025 % ointment, Apply 1 application 2 (two) times daily topically. (Patient not taking: Reported on 06/06/2017), Disp: 30 g, Rfl: 0  Allergies  Allergen Reactions  . Codeine   . Hydromorphone      Review of Systems  Constitutional: Negative for chills.  HENT: Positive for congestion and sinus pressure. Negative for sore throat.   Respiratory: Positive for cough.   Neurological: Positive for headaches.     Objective  Vitals:   06/06/17 1136  BP: 112/64  Pulse: 63  Resp: 18  Temp: 98.1 F (36.7 C)  TempSrc: Oral  SpO2: 99%  Weight: 149 lb 6.4 oz (67.8 kg)  Height: 5\' 6"  (1.676 m)    Physical Exam  Constitutional: He is oriented to person, place, and time and well-developed, well-nourished, and in no distress.  HENT:  Head: Normocephalic.  Nose: Right sinus exhibits maxillary sinus tenderness. Left sinus exhibits maxillary sinus tenderness.  Mouth/Throat: Oropharynx is clear and moist.  Nasal mucosal inflammation.   Cardiovascular: Normal rate, regular rhythm, S1 normal, S2 normal and normal heart  sounds.  No murmur heard. Pulmonary/Chest: Effort normal and breath sounds normal. He has no wheezes.  Neurological: He is alert and oriented to person, place, and time.  Psychiatric: Mood, memory, affect and judgment normal.  Nursing note and vitals reviewed.       Assessment & Plan  1. Chronic maxillary sinusitis Given duration and history, we'll start treatment with Augmentin - amoxicillin-clavulanate (AUGMENTIN) 875-125 MG tablet; Take 1 tablet by mouth 2 (two) times daily.  Dispense: 20 tablet; Refill: 0   Rylei Masella Asad A[facility] 06/06/2017 11:50 AM

## 2017-08-06 ENCOUNTER — Ambulatory Visit (INDEPENDENT_AMBULATORY_CARE_PROVIDER_SITE_OTHER): Payer: Managed Care, Other (non HMO) | Admitting: Family Medicine

## 2017-08-06 ENCOUNTER — Encounter: Payer: Self-pay | Admitting: Family Medicine

## 2017-08-06 VITALS — BP 126/84 | HR 77 | Temp 98.0°F | Ht 66.0 in | Wt 151.1 lb

## 2017-08-06 DIAGNOSIS — Z800 Family history of malignant neoplasm of digestive organs: Secondary | ICD-10-CM

## 2017-08-06 DIAGNOSIS — Z23 Encounter for immunization: Secondary | ICD-10-CM | POA: Insufficient documentation

## 2017-08-06 DIAGNOSIS — D649 Anemia, unspecified: Secondary | ICD-10-CM | POA: Diagnosis not present

## 2017-08-06 DIAGNOSIS — G40409 Other generalized epilepsy and epileptic syndromes, not intractable, without status epilepticus: Secondary | ICD-10-CM

## 2017-08-06 DIAGNOSIS — R0683 Snoring: Secondary | ICD-10-CM | POA: Diagnosis not present

## 2017-08-06 DIAGNOSIS — Z5181 Encounter for therapeutic drug level monitoring: Secondary | ICD-10-CM | POA: Diagnosis not present

## 2017-08-06 DIAGNOSIS — Z1389 Encounter for screening for other disorder: Secondary | ICD-10-CM | POA: Insufficient documentation

## 2017-08-06 DIAGNOSIS — Z716 Tobacco abuse counseling: Secondary | ICD-10-CM

## 2017-08-06 MED ORDER — PHENYTOIN SODIUM EXTENDED 100 MG PO CAPS: 200.0000 mg | ORAL_CAPSULE | Freq: Two times a day (BID) | ORAL | 1 refills | Status: DC

## 2017-08-06 NOTE — Assessment & Plan Note (Signed)
Last seizures was years ago; continue dilantin; check level today

## 2017-08-06 NOTE — Patient Instructions (Addendum)
I do encourage you to quit smoking Call [phone] to sign up for smoking cessation classes You can call 1-800-QUIT-NOW to talk with a smoking cessation coach You've got this!!! Let's get labs today If you have not heard anything from my staff in a week about any orders/referrals/studies from today, please contact us here to follow-up [phone]  Health Risks of Smoking Smoking cigarettes is very bad for your health. Tobacco smoke has over 200 known poisons in it. It contains the poisonous gases nitrogen oxide and carbon monoxide. There are over 60 chemicals in tobacco smoke that cause cancer. Smoking is difficult to quit because a chemical in tobacco, called nicotine, causes addiction or dependence. When you smoke and inhale, nicotine is absorbed rapidly into the bloodstream through your lungs. Both inhaled and non-inhaled nicotine may be addictive. What are the risks of cigarette smoke? Cigarette smokers have an increased risk of many serious medical problems, including:  Lung cancer.  Lung disease, such as pneumonia, bronchitis, and emphysema.  Chest pain (angina) and heart attack because the heart is not getting enough oxygen.  Heart disease and peripheral blood vessel disease.  High blood pressure (hypertension).  Stroke.  Oral cancer, including cancer of the lip, mouth, or voice box.  Bladder cancer.  Pancreatic cancer.  Cervical cancer.  Pregnancy complications, including premature birth.  Stillbirths and smaller newborn babies, birth defects, and genetic damage to sperm.  Early menopause.  Lower estrogen level for women.  Infertility.  Facial wrinkles.  Blindness.  Increased risk of broken bones (fractures).  Senile dementia.  Stomach ulcers and internal bleeding.  Delayed wound healing and increased risk of complications during surgery.  Even smoking lightly shortens your life expectancy by several years.  Because of secondhand smoke exposure,  children of smokers have an increased risk of the following:  Sudden infant death syndrome (SIDS).  Respiratory infections.  Lung cancer.  Heart disease.  Ear infections.  What are the benefits of quitting? There are many health benefits of quitting smoking. Here are some of them:  Within days of quitting smoking, your risk of having a heart attack decreases, your blood flow improves, and your lung capacity improves. Blood pressure, pulse rate, and breathing patterns start returning to normal soon after quitting.  Within months, your lungs may clear up completely.  Quitting for 10 years reduces your risk of developing lung cancer and heart disease to almost that of a nonsmoker.  People who quit may see an improvement in their overall quality of life.  How do I quit smoking? Smoking is an addiction with both physical and psychological effects, and longtime habits can be hard to change. Your health care provider can recommend:  Programs and community resources, which may include group support, education, or talk therapy.  Prescription medicines to help reduce cravings.  Nicotine replacement products, such as patches, gum, and nasal sprays. Use these products only as directed. Do not replace cigarette smoking with electronic cigarettes, which are commonly called e-cigarettes. The safety of e-cigarettes is not known, and some may contain harmful chemicals.  A combination of two or more of these methods.  Where to find more information:  American Lung Association: www.lung.org  American Cancer Society: www.cancer.org Summary  Smoking cigarettes is very bad for your health. Cigarette smokers have an increased risk of many serious medical problems, including several cancers, heart disease, and stroke.  Smoking is an addiction with both physical and psychological effects, and longtime habits can be hard to change.  By stopping right away, you can greatly reduce the risk of  medical problems for you and your family.  To help you quit smoking, your health care provider can recommend programs, community resources, prescription medicines, and nicotine replacement products such as patches, gum, and nasal sprays. This information is not intended to replace advice given to you by your health care provider. Make sure you discuss any questions you have with your health care provider. Document Released: 05/16/2004 Document Revised: 04/12/2016 Document Reviewed: 04/12/2016 Elsevier Interactive Patient Education  2017 [employer].  Steps to Quit Smoking Smoking tobacco can be bad for your health. It can also affect almost every organ in your body. Smoking puts you and people around you at risk for many serious long-lasting (chronic) diseases. Quitting smoking is hard, but it is one of the best things that you can do for your health. It is never too late to quit. What are the benefits of quitting smoking? When you quit smoking, you lower your risk for getting serious diseases and conditions. They can include:  Lung cancer or lung disease.  Heart disease.  Stroke.  Heart attack.  Not being able to have children (infertility).  Weak bones (osteoporosis) and broken bones (fractures).  If you have coughing, wheezing, and shortness of breath, those symptoms may get better when you quit. You may also get sick less often. If you are pregnant, quitting smoking can help to lower your chances of having a baby of low birth weight. What can I do to help me quit smoking? Talk with your doctor about what can help you quit smoking. Some things you can do (strategies) include:  Quitting smoking totally, instead of slowly cutting back how much you smoke over a period of time.  Going to in-person counseling. You are more likely to quit if you go to many counseling sessions.  Using resources and support systems, such as: ? Database administrator with a Social worker. ? Phone  quitlines. ? Careers information officer. ? Support groups or group counseling. ? Text messaging programs. ? Mobile phone apps or applications.  Taking medicines. Some of these medicines may have nicotine in them. If you are pregnant or breastfeeding, do not take any medicines to quit smoking unless your doctor says it is okay. Talk with your doctor about counseling or other things that can help you.  Talk with your doctor about using more than one strategy at the same time, such as taking medicines while you are also going to in-person counseling. This can help make quitting easier. What things can I do to make it easier to quit? Quitting smoking might feel very hard at first, but there is a lot that you can do to make it easier. Take these steps:  Talk to your family and friends. Ask them to support and encourage you.  Call phone quitlines, reach out to support groups, or work with a Social worker.  Ask people who smoke to not smoke around you.  Avoid places that make you want (trigger) to smoke, such as: ? Bars. ? Parties. ? Smoke-break areas at work.  Spend time with people who do not smoke.  Lower the stress in your life. Stress can make you want to smoke. Try these things to help your stress: ? Getting regular exercise. ? Deep-breathing exercises. ? Yoga. ? Meditating. ? Doing a body scan. To do this, close your eyes, focus on one area of your body at a time from head to toe, and notice which parts  of your body are tense. Try to relax the muscles in those areas.  Download or buy apps on your mobile phone or tablet that can help you stick to your quit plan. There are many free apps, such as QuitGuide from the [employer] Office manager for Disease Control and Prevention). You can find more support from smokefree.gov and other websites.  This information is not intended to replace advice given to you by your health care provider. Make sure you discuss any questions you have with your health care  provider. Document Released: 02/02/2009 Document Revised: 12/05/2015 Document Reviewed: 08/23/2014 Elsevier Interactive Patient Education  2018 [employer].

## 2017-08-06 NOTE — Assessment & Plan Note (Signed)
No witnessed apnea, demonstrated open versus closed glottis; wife will watch; patient is well rested and refreshed in the morning, so doubtful he has OSA; he declined referral

## 2017-08-06 NOTE — Assessment & Plan Note (Signed)
Encouraged patient to quit; pick a quick date; wife may quit too; suggested straw, changing habits, switching chairs at the table or drinking coffee in another room, etc.; see AVS for ideas

## 2017-08-06 NOTE — Progress Notes (Signed)
BP 126/84 (BP Location: Right Arm, Patient Position: Sitting, Cuff Size: Normal)   Pulse 77   Temp 98 F (36.7 C) (Oral)   Ht 5\' 6"  (1.676 m)   Wt 151 lb 1.6 oz (68.5 kg)   SpO2 99%   BMI 24.39 kg/m    Subjective:    Patient ID: Nathan Mack, male    DOB: 1955/12/29, 62 y.o.   MRN: [id number]  HPI: Nathan Mack is a 62 y.o. male  Chief Complaint  Patient presents with  . Medication Refill    90 day supply please     HPI Patient is new to me; previous provider left our practice Seizures; age 42; does not know if anyone in fam hx it; grand mal seizures Last seizure was years ago Does drive Keeps it under control Does not have a neurologist; last one was in [place] were done in November, but pt does not know results PSA normal, enlarged prostate but no cancer; PSA 0.8 Hep C negative LDL 109; eating better Anemia, H/H 03/30/35.2; no blood in the stool Tobacco abuse; tried to quit before; first cig of the day within 15 minutes of rising; last cig varies, can go for an hour; new car, does not smoke in the car; occasionally smokes in the middle of the night, might have a cigarette and few puffs and then back to bed; less than 1 ppd, down 1/2 ppd really; stress is a trigger; morning coffee, coffee all day long Snores really loud like a freight train; not sure if positional; any position; not stopping breathing at all; wife will watch and call if apnea; not referral desired  Depression screen [facility] 2/9 08/06/2017 06/06/2017 03/06/2017 02/18/2017 08/13/2016  Decreased Interest 0 0 0 0 0  Down, Depressed, Hopeless 0 0 0 0 0  PHQ - 2 Score 0 0 0 0 0    Relevant past medical, surgical, family and social history reviewed Past Medical History:  Diagnosis Date  . Seizures ([place])    Past Surgical History:  Procedure Laterality Date  . LUMBAR DISC SURGERY     Family History  Problem Relation Age of Onset  . Cancer Mother        colon   Social History   Tobacco Use  .  Smoking status: Current Every Day Smoker    Packs/day: 0.50    Types: Cigarettes  . Smokeless tobacco: Never Used  Substance Use Topics  . Alcohol use: No    Alcohol/week: 0.0 oz  . Drug use: No    Interim medical history since last visit reviewed. Allergies and medications reviewed  Review of Systems Per HPI unless specifically indicated above     Objective:    BP 126/84 (BP Location: Right Arm, Patient Position: Sitting, Cuff Size: Normal)   Pulse 77   Temp 98 F (36.7 C) (Oral)   Ht 5\' 6"  (1.676 m)   Wt 151 lb 1.6 oz (68.5 kg)   SpO2 99%   BMI 24.39 kg/m   Wt Readings from Last 3 Encounters:  08/06/17 151 lb 1.6 oz (68.5 kg)  06/06/17 149 lb 6.4 oz (67.8 kg)  03/06/17 150 lb 6.4 oz (68.2 kg)    Physical Exam  Constitutional: He appears well-developed and well-nourished. No distress.  HENT:  Head: Normocephalic and atraumatic.  Nose: No mucosal edema or rhinorrhea.  Mouth/Throat: Mucous membranes are normal. No posterior oropharyngeal edema or posterior oropharyngeal erythema.  mallampati class I  Eyes: EOM are normal. No  scleral icterus.  Neck: No thyromegaly present.  Cardiovascular: Normal rate and regular rhythm.  Pulmonary/Chest: Effort normal and breath sounds normal.  Abdominal: Soft. Bowel sounds are normal. He exhibits no distension.  Musculoskeletal: He exhibits no edema.  Neurological: Coordination normal.  Skin: Skin is warm and dry. No pallor.  Psychiatric: He has a normal mood and affect. His behavior is normal. Judgment and thought content normal.    Results for orders placed or performed in visit on 03/06/17  CBC with Differential/Platelet  Result Value Ref Range   WBC 7.3 3.8 - 10.8 Thousand/uL   RBC 3.85 (L) 4.20 - 5.80 Million/uL   Hemoglobin 12.8 (L) 13.2 - 17.1 g/dL   HCT 36.2 (L) 38.5 - 50.0 %   MCV 94.0 80.0 - 100.0 fL   MCH 33.2 (H) 27.0 - 33.0 pg   MCHC 35.4 32.0 - 36.0 g/dL   RDW 12.7 11.0 - 15.0 %   Platelets 294 140 - 400  Thousand/uL   MPV 9.6 7.5 - 12.5 fL   Neutro Abs 3,555 1,500 - 7,800 cells/uL   Lymphs Abs 2,927 850 - 3,900 cells/uL   WBC mixed population 467 200 - 950 cells/uL   Eosinophils Absolute 277 15 - 500 cells/uL   Basophils Absolute 73 0 - 200 cells/uL   Neutrophils Relative % 48.7 %   Total Lymphocyte 40.1 %   Monocytes Relative 6.4 %   Eosinophils Relative 3.8 %   Basophils Relative 1.0 %  COMPLETE METABOLIC PANEL WITH GFR  Result Value Ref Range   Glucose, Bld 85 65 - 99 mg/dL   BUN 15 7 - 25 mg/dL   Creat 0.86 0.70 - 1.25 mg/dL   GFR, Est Non African American 94 > OR = 60 mL/min/1.56m2   GFR, Est African American 108 > OR = 60 mL/min/1.30m2   BUN/Creatinine Ratio NOT APPLICABLE 6 - 22 (calc)   Sodium 138 135 - 146 mmol/L   Potassium 4.1 3.5 - 5.3 mmol/L   Chloride 105 98 - 110 mmol/L   CO2 26 20 - 32 mmol/L   Calcium 9.2 8.6 - 10.3 mg/dL   Total Protein 7.3 6.1 - 8.1 g/dL   Albumin 4.4 3.6 - 5.1 g/dL   Globulin 2.9 1.9 - 3.7 g/dL (calc)   AG Ratio 1.5 1.0 - 2.5 (calc)   Total Bilirubin 0.4 0.2 - 1.2 mg/dL   Alkaline phosphatase (APISO) 107 40 - 115 U/L   AST 18 10 - 35 U/L   ALT 15 9 - 46 U/L  Lipid panel  Result Value Ref Range   Cholesterol 195 <200 mg/dL   HDL 60 >40 mg/dL   Triglycerides 138 <150 mg/dL   LDL Cholesterol (Calc) 109 (H) mg/dL (calc)   Total CHOL/HDL Ratio 3.3 <5.0 (calc)   Non-HDL Cholesterol (Calc) 135 (H) <130 mg/dL (calc)  VITAMIN D 25 Hydroxy (Vit-D Deficiency, Fractures)  Result Value Ref Range   Vit D, 25-Hydroxy 54 30 - 100 ng/mL  TSH  Result Value Ref Range   TSH 1.83 0.40 - 4.50 mIU/L  Hepatitis C antibody  Result Value Ref Range   Hepatitis C Ab NON-REACTIVE NON-REACTI   SIGNAL TO CUT-OFF 0.05 <1.00  PSA  Result Value Ref Range   PSA 0.8 < OR = 4.0 ng/mL      Assessment & Plan:   Problem List Items Addressed This Visit      Nervous and Auditory   Seizure disorder, grand mal ([place])    Last  seizures was years ago; continue  dilantin; check level today      Relevant Medications   phenytoin (DILANTIN) 100 MG ER capsule   Other Relevant Orders   Dilantin (Phenytoin) level, total     Other   Tobacco abuse counseling    Encouraged patient to quit; pick a quick date; wife may quit too; suggested straw, changing habits, switching chairs at the table or drinking coffee in another room, etc.; see AVS for ideas      Snoring    No witnessed apnea, demonstrated open versus closed glottis; wife will watch; patient is well rested and refreshed in the morning, so doubtful he has OSA; he declined referral      Family history of colon cancer in mother    He should be having colonoscopies every 5 years, not 73; HM list updated; refer for colonoscopy      Relevant Orders   Ambulatory referral to Gastroenterology   Anemia - Primary    Check labs today; refer for colonoscopy      Relevant Orders   Ambulatory referral to Gastroenterology   CBC with Differential/Platelet   Fe+TIBC+Fer   B12 and Folate Panel    Other Visit Diagnoses    Therapeutic drug monitoring       check dilantin level   Relevant Medications   phenytoin (DILANTIN) 100 MG ER capsule   Other Relevant Orders   Dilantin (Phenytoin) level, total       Follow up plan: Return in about 6 months (around 02/05/2018) for follow-up visit with Dr. Sanda Klein.  An after-visit summary was printed and given to the patient at [facility].  Please see the patient instructions which may contain other information and recommendations beyond what is mentioned above in the assessment and plan.  Meds ordered this encounter  Medications  . phenytoin (DILANTIN) 100 MG ER capsule    Sig: Take 2 capsules (200 mg total) by mouth 2 (two) times daily.    Dispense:  360 capsule    Refill:  1    Orders Placed This Encounter  Procedures  . CBC with Differential/Platelet  . Fe+TIBC+Fer  . B12 and Folate Panel  . Dilantin (Phenytoin) level, total  . Ambulatory referral  to Gastroenterology

## 2017-08-06 NOTE — Assessment & Plan Note (Signed)
Check labs today; refer for colonoscopy

## 2017-08-06 NOTE — Assessment & Plan Note (Signed)
He should be having colonoscopies every 5 years, not 10; HM list updated; refer for colonoscopy

## 2017-08-07 LAB — IRON,TIBC AND FERRITIN PANEL
%SAT: 35 % (calc) (ref 15–60)
FERRITIN: 99 ng/mL (ref 20–380)
IRON: 104 ug/dL (ref 50–180)
TIBC: 294 mcg/dL (calc) (ref 250–425)

## 2017-08-07 LAB — CBC WITH DIFFERENTIAL/PLATELET
BASOS PCT: 1.1 %
Basophils Absolute: 77 cells/uL (ref 0–200)
EOS ABS: 259 {cells}/uL (ref 15–500)
Eosinophils Relative: 3.7 %
HCT: 39 % (ref 38.5–50.0)
HEMOGLOBIN: 13.9 g/dL (ref 13.2–17.1)
Lymphs Abs: 2576 cells/uL (ref 850–3900)
MCH: 33.4 pg — AB (ref 27.0–33.0)
MCHC: 35.6 g/dL (ref 32.0–36.0)
MCV: 93.8 fL (ref 80.0–100.0)
MPV: 9.4 fL (ref 7.5–12.5)
Monocytes Relative: 6.3 %
NEUTROS ABS: 3647 {cells}/uL (ref 1500–7800)
Neutrophils Relative %: 52.1 %
PLATELETS: 292 10*3/uL (ref 140–400)
RBC: 4.16 10*6/uL — AB (ref 4.20–5.80)
RDW: 12.6 % (ref 11.0–15.0)
Total Lymphocyte: 36.8 %
WBC: 7 10*3/uL (ref 3.8–10.8)
WBCMIX: 441 {cells}/uL (ref 200–950)

## 2017-08-07 LAB — PHENYTOIN LEVEL, TOTAL: PHENYTOIN, TOTAL: 20 mg/L (ref 10.0–20.0)

## 2017-08-07 LAB — B12 AND FOLATE PANEL
Folate: >24 ng/mL
VITAMIN B 12: 450 pg/mL (ref 200–1100)

## 2017-08-08 ENCOUNTER — Telehealth: Payer: Self-pay

## 2017-08-08 DIAGNOSIS — Z5181 Encounter for therapeutic drug level monitoring: Secondary | ICD-10-CM

## 2017-08-08 NOTE — Telephone Encounter (Signed)
-----   Message from Arnetha Courser, MD sent at 08/08/2017  3:00 PM EDT ----- Roselyn Reef, please let the patient know that his phenytoin level is in the normal range, but right at the upper limit of normal. Let's recheck this in 3 months (please order phenytoin level, dx therapeutic drug monitoring); his B12 and folate are normal

## 2017-08-28 ENCOUNTER — Other Ambulatory Visit: Payer: Self-pay

## 2017-08-28 ENCOUNTER — Ambulatory Visit (INDEPENDENT_AMBULATORY_CARE_PROVIDER_SITE_OTHER): Payer: Managed Care, Other (non HMO) | Admitting: Gastroenterology

## 2017-08-28 ENCOUNTER — Encounter: Payer: Self-pay | Admitting: Gastroenterology

## 2017-08-28 VITALS — BP 138/73 | HR 64 | Ht 66.0 in | Wt 153.0 lb

## 2017-08-28 DIAGNOSIS — D500 Iron deficiency anemia secondary to blood loss (chronic): Secondary | ICD-10-CM

## 2017-08-28 NOTE — Progress Notes (Signed)
Gastroenterology Consultation  Referring Provider:     Arnetha Courser, MD Primary Care Physician:  Arnetha Courser, MD Primary Gastroenterologist:  Dr. Allen Norris     Reason for Consultation:     Anemia        HPI:   Nathan Mack is a 62 y.o. y/o male referred for consultation & management of anemia by Dr. Arnetha Courser, MD.  This patient comes in today after being found to have anemia at his blood draw back on November 15th of 2018.  The patient had repeat blood work 3 weeks ago that showed his hemoglobin and hematocrit back to normal.  The patient also had iron studies done 3 weeks ago that showed normal iron saturation and normal iron levels. The patient reports that his anemia had improved due to him being on iron.  He denies any black stools or bloody stools.  He also denies any abdominal pain nausea vomiting fevers or chills.  The patient does have a mother with a history of colon cancer and states that he gets a colonoscopy 5 years.  The patient's last colonoscopy was 7 years ago in [place].  Past Medical History:  Diagnosis Date  . Seizures ([place])     Past Surgical History:  Procedure Laterality Date  . LUMBAR DISC SURGERY      Prior to Admission medications   Medication Sig Start Date End Date Taking? Authorizing Provider  CALCIUM CARBONATE-VIT D-MIN PO Take by mouth daily.     [provider]  folic acid (FOLVITE) 270 MCG tablet Take 800 mcg by mouth daily.     [provider]  phenytoin (DILANTIN) 100 MG ER capsule Take 2 capsules (200 mg total) by mouth 2 (two) times daily. 08/06/17   Arnetha Courser, MD    Family History  Problem Relation Age of Onset  . Cancer Mother        colon     Social History   Tobacco Use  . Smoking status: Current Every Day Smoker    Packs/day: 0.50    Types: Cigarettes  . Smokeless tobacco: Never Used  Substance Use Topics  . Alcohol use: No    Alcohol/week: 0.0 oz  . Drug use: No    Allergies as of 08/28/2017  - Review Complete 08/06/2017  Allergen Reaction Noted  . Codeine  11/10/2014  . Hydromorphone  11/10/2014    Review of Systems:    All systems reviewed and negative except where noted in HPI.   Physical Exam:  There were no vitals taken for this visit. No LMP for male patient. Psych:  Alert and cooperative. Normal mood and affect. General:   Alert,  Well-developed, well-nourished, pleasant and cooperative in NAD Head:  Normocephalic and atraumatic. Eyes:  Sclera clear, no icterus.   Conjunctiva pink. Ears:  Normal auditory acuity. Nose:  No deformity, discharge, or lesions. Mouth:  No deformity or lesions,oropharynx pink & moist. Neck:  Supple; no masses or thyromegaly. Lungs:  Respirations even and unlabored.  Clear throughout to auscultation.   No wheezes, crackles, or rhonchi. No acute distress. Heart:  Regular rate and rhythm; no murmurs, clicks, rubs, or gallops. Abdomen:  Normal bowel sounds.  No bruits.  Soft, non-tender and non-distended without masses, hepatosplenomegaly or hernias noted.  No guarding or rebound tenderness.  Negative Carnett sign.   Rectal:  Deferred.  Msk:  Symmetrical without gross deformities.  Good, equal movement & strength bilaterally. Pulses:  Normal pulses noted. Extremities:  No clubbing or edema.  No cyanosis. Neurologic:  Alert and oriented x3;  grossly normal neurologically. Skin:  Intact without significant lesions or rashes.  No jaundice. Lymph Nodes:  No significant cervical adenopathy. Psych:  Alert and cooperative. Normal mood and affect.  Imaging Studies: No results found.  Assessment and Plan:   Nathan Mack is a 62 y.o. y/o male who comes in with a family history of colon cancer and has been found to have anemia that responded to iron therapy.  The patient will be set up for an EGD and colonoscopy. The patient has been explained the plan and agrees with it.  Lucilla Lame, MD. Marval Regal   Note: This dictation was prepared with Dragon  dictation along with smaller phrase technology. Any transcriptional errors that result from this process are unintentional.

## 2017-09-05 ENCOUNTER — Ambulatory Visit: Payer: Managed Care, Other (non HMO) | Admitting: Family Medicine

## 2017-09-08 ENCOUNTER — Encounter: Payer: Self-pay | Admitting: *Deleted

## 2017-09-09 ENCOUNTER — Ambulatory Visit: Payer: Managed Care, Other (non HMO) | Admitting: Certified Registered"

## 2017-09-09 ENCOUNTER — Encounter
Admission: RE | Disposition: A | Discharge status: Home / Self Care | Payer: Self-pay | Source: Ambulatory Visit | Attending: Gastroenterology

## 2017-09-09 ENCOUNTER — Ambulatory Visit
Admission: RE | Admit: 2017-09-09 | Discharge: 2017-09-09 | Disposition: A | Discharge status: Home / Self Care | Payer: Managed Care, Other (non HMO) | Source: Ambulatory Visit | Attending: Gastroenterology | Admitting: Gastroenterology

## 2017-09-09 ENCOUNTER — Encounter: Payer: Self-pay | Admitting: *Deleted

## 2017-09-09 DIAGNOSIS — Z79899 Other long term (current) drug therapy: Secondary | ICD-10-CM | POA: Diagnosis not present

## 2017-09-09 DIAGNOSIS — Z800 Family history of malignant neoplasm of digestive organs: Secondary | ICD-10-CM | POA: Diagnosis not present

## 2017-09-09 DIAGNOSIS — D509 Iron deficiency anemia, unspecified: Secondary | ICD-10-CM | POA: Insufficient documentation

## 2017-09-09 DIAGNOSIS — D500 Iron deficiency anemia secondary to blood loss (chronic): Secondary | ICD-10-CM

## 2017-09-09 DIAGNOSIS — D124 Benign neoplasm of descending colon: Secondary | ICD-10-CM

## 2017-09-09 DIAGNOSIS — K2950 Unspecified chronic gastritis without bleeding: Secondary | ICD-10-CM | POA: Diagnosis not present

## 2017-09-09 DIAGNOSIS — Z885 Allergy status to narcotic agent status: Secondary | ICD-10-CM | POA: Insufficient documentation

## 2017-09-09 DIAGNOSIS — D122 Benign neoplasm of ascending colon: Secondary | ICD-10-CM | POA: Diagnosis not present

## 2017-09-09 DIAGNOSIS — K2970 Gastritis, unspecified, without bleeding: Secondary | ICD-10-CM

## 2017-09-09 DIAGNOSIS — K3189 Other diseases of stomach and duodenum: Secondary | ICD-10-CM | POA: Diagnosis not present

## 2017-09-09 DIAGNOSIS — B9681 Helicobacter pylori [H. pylori] as the cause of diseases classified elsewhere: Secondary | ICD-10-CM | POA: Diagnosis not present

## 2017-09-09 DIAGNOSIS — F17210 Nicotine dependence, cigarettes, uncomplicated: Secondary | ICD-10-CM | POA: Insufficient documentation

## 2017-09-09 DIAGNOSIS — D123 Benign neoplasm of transverse colon: Secondary | ICD-10-CM | POA: Diagnosis not present

## 2017-09-09 HISTORY — PX: ESOPHAGOGASTRODUODENOSCOPY (EGD) WITH PROPOFOL: SHX5813

## 2017-09-09 HISTORY — PX: COLONOSCOPY WITH PROPOFOL: SHX5780

## 2017-09-09 SURGERY — COLONOSCOPY WITH PROPOFOL
Anesthesia: General

## 2017-09-09 MED ORDER — LIDOCAINE HCL (PF) 2 % IJ SOLN
INTRAMUSCULAR | Status: AC
  Filled 2017-09-09: qty 10

## 2017-09-09 MED ORDER — PROPOFOL 500 MG/50ML IV EMUL
INTRAVENOUS | Status: DC | PRN
  Administered 2017-09-09: 100 ug/kg/min via INTRAVENOUS

## 2017-09-09 MED ORDER — GLYCOPYRROLATE 0.2 MG/ML IJ SOLN
INTRAMUSCULAR | Status: DC | PRN
  Administered 2017-09-09: 0.1 mg via INTRAVENOUS

## 2017-09-09 MED ORDER — LIDOCAINE HCL (CARDIAC) PF 100 MG/5ML IV SOSY
PREFILLED_SYRINGE | INTRAVENOUS | Status: DC | PRN
  Administered 2017-09-09: 50 mg via INTRAVENOUS

## 2017-09-09 MED ORDER — GLYCOPYRROLATE 0.2 MG/ML IJ SOLN
INTRAMUSCULAR | Status: AC
  Filled 2017-09-09: qty 1

## 2017-09-09 MED ORDER — SODIUM CHLORIDE 0.9 % IV SOLN
INTRAVENOUS | Status: DC
  Administered 2017-09-09: 1000 mL via INTRAVENOUS

## 2017-09-09 MED ORDER — PHENYLEPHRINE HCL 10 MG/ML IJ SOLN
INTRAMUSCULAR | Status: DC | PRN
  Administered 2017-09-09: 200 ug via INTRAVENOUS
  Administered 2017-09-09 (×4): 100 ug via INTRAVENOUS

## 2017-09-09 MED ORDER — PHENYLEPHRINE HCL 10 MG/ML IJ SOLN
INTRAMUSCULAR | Status: AC
  Filled 2017-09-09: qty 1

## 2017-09-09 MED ORDER — PROPOFOL 10 MG/ML IV BOLUS
INTRAVENOUS | Status: DC | PRN
  Administered 2017-09-09 (×2): 100 mg via INTRAVENOUS

## 2017-09-09 MED ORDER — PROPOFOL 500 MG/50ML IV EMUL
INTRAVENOUS | Status: AC
  Filled 2017-09-09: qty 50

## 2017-09-09 NOTE — Anesthesia Postprocedure Evaluation (Signed)
Anesthesia Post Note  Patient: Ac Colan  Procedure(s) Performed: COLONOSCOPY WITH PROPOFOL (N/A ) ESOPHAGOGASTRODUODENOSCOPY (EGD) WITH PROPOFOL (N/A )  Patient location during evaluation: Endoscopy Anesthesia Type: General Level of consciousness: awake and alert, oriented and patient cooperative Pain management: satisfactory to patient Vital Signs Assessment: post-procedure vital signs reviewed and stable Respiratory status: spontaneous breathing and respiratory function stable Cardiovascular status: blood pressure returned to baseline and stable Postop Assessment: no headache, no backache, patient able to bend at knees, no apparent nausea or vomiting, adequate PO intake and able to ambulate Anesthetic complications: no     Last Vitals:  Vitals:   09/09/17 0824 09/09/17 1019  BP:  102/66  Pulse: 65 (!) 49  Resp: 18   Temp: (!) 36.1 C (!) 36.1 C  SpO2: 100% 99%    Last Pain:  Vitals:   09/09/17 1019  TempSrc:   PainSc: [place]

## 2017-09-09 NOTE — Anesthesia Post-op Follow-up Note (Signed)
Anesthesia QCDR form completed.        

## 2017-09-09 NOTE — Brief Op Note (Signed)
Polyp cold snare in descending colon not retrieved

## 2017-09-09 NOTE — Op Note (Signed)
[facility] Gastroenterology Patient Name: Nathan Mack Procedure Date: 09/09/2017 9:41 AM MRN: [id number] Account #: [id number] Date of Birth: [birth year] Admit Type: Outpatient Age: 63 Room: [facility] ENDO ROOM 4 Gender: Male Note Status: Finalized Procedure:            Colonoscopy Indications:          Iron deficiency anemia Providers:            Lucilla Lame MD, MD Referring MD:         Arnetha Courser (Referring MD) Medicines:            Propofol per Anesthesia Complications:        No immediate complications. Procedure:            Pre-Anesthesia Assessment:                       - Prior to the procedure, a History and Physical was                        performed, and patient medications and allergies were                        reviewed. The patient's tolerance of previous                        anesthesia was also reviewed. The risks and benefits of                        the procedure and the sedation options and risks were                        discussed with the patient. All questions were                        answered, and informed consent was obtained. Prior                        Anticoagulants: The patient has taken no previous                        anticoagulant or antiplatelet agents. ASA Grade                        Assessment: II - A patient with mild systemic disease.                        After reviewing the risks and benefits, the patient was                        deemed in satisfactory condition to undergo the                        procedure.                       After obtaining informed consent, the colonoscope was                        passed under direct vision. Throughout the procedure,  the patient's blood pressure, pulse, and oxygen                        saturations were monitored continuously. The                        Colonoscope was introduced through the anus and                        advanced to the the  cecum, identified by appendiceal                        orifice and ileocecal valve. The colonoscopy was                        performed without difficulty. The patient tolerated the                        procedure well. The quality of the bowel preparation                        was excellent. Findings:      The perianal and digital rectal examinations were normal.      Two sessile polyps were found in the ascending colon. The polyps were 4       to 8 mm in size. These polyps were removed with a cold snare. Resection       and retrieval were complete.      A 7 mm polyp was found in the transverse colon. The polyp was sessile.       The polyp was removed with a cold snare. Resection and retrieval were       complete.      A 12 mm polyp was found in the descending colon. The polyp was       pedunculated. The polyp was removed with a hot snare. Resection and       retrieval were complete. To prevent bleeding post-intervention, one       hemostatic clip was successfully placed (MR conditional). There was no       bleeding at the end of the procedure. Impression:           - Two 4 to 8 mm polyps in the ascending colon, removed                        with a cold snare. Resected and retrieved.                       - One 7 mm polyp in the transverse colon, removed with                        a cold snare. Resected and retrieved.                       - One 12 mm polyp in the descending colon, removed with                        a hot snare. Resected and retrieved. Clip (MR                        conditional)  was placed. Recommendation:       - Discharge patient to home.                       - Resume previous diet.                       - Continue present medications.                       - Await pathology results.                       - Repeat colonoscopy in 1 year for surveillance. Procedure Code(s):    --- Professional ---                       [phone], Colonoscopy, flexible; with  removal of tumor(s),                        polyp(s), or other lesion(s) by snare technique Diagnosis Code(s):    --- Professional ---                       D50.9, Iron deficiency anemia, unspecified                       D12.4, Benign neoplasm of descending colon                       D12.3, Benign neoplasm of transverse colon (hepatic                        flexure or splenic flexure)                       D12.2, Benign neoplasm of ascending colon CPT copyright 2017 American Medical Association. All rights reserved. The codes documented in this report are preliminary and upon coder review may  be revised to meet current compliance requirements. Lucilla Lame MD, MD 09/09/2017 10:15:08 AM This report has been signed electronically. Number of Addenda: 0 Note Initiated On: 09/09/2017 9:41 AM Scope Withdrawal Time: 0 hours 17 minutes 4 seconds  Total Procedure Duration: 0 hours 20 minutes 7 seconds       [facility]

## 2017-09-09 NOTE — H&P (Signed)
Lucilla Lame, MD [address] [place], [place] [address] Phone:[phone] Fax : [phone]  Primary Care Physician:  Arnetha Courser, MD Primary Gastroenterologist:  Dr. Allen Norris  Pre-Procedure History & Physical: HPI:  Nathan Mack is a 62 y.o. male is here for an endoscopy and colonoscopy.   History reviewed. No pertinent past medical history.  Past Surgical History:  Procedure Laterality Date  . LUMBAR DISC SURGERY      Prior to Admission medications   Medication Sig Start Date End Date Taking? Authorizing Provider  CALCIUM CARBONATE-VIT D-MIN PO Take by mouth daily.    Yes [provider]  Ferrous Sulfate (IRON SUPPLEMENT PO) Take by mouth.   Yes [provider]  folic acid (FOLVITE) 426 MCG tablet Take 800 mcg by mouth daily.    Yes [provider]  phenytoin (DILANTIN) 100 MG ER capsule Take 2 capsules (200 mg total) by mouth 2 (two) times daily. 08/06/17  Yes Lada, Satira Anis, MD  vitamin B-12 (CYANOCOBALAMIN) 1000 MCG tablet Take 1,000 mcg by mouth daily.   Yes [provider]    Allergies as of 08/29/2017 - Review Complete 08/28/2017  Allergen Reaction Noted  . Codeine  11/10/2014  . Hydromorphone  11/10/2014    Family History  Problem Relation Age of Onset  . Cancer Mother        colon    Social History   Socioeconomic History  . Marital status: Married    Spouse name: Not on file  . Number of children: Not on file  . Years of education: Not on file  . Highest education level: Not on file  Occupational History  . Not on file  Social Needs  . Financial resource strain: Not on file  . Food insecurity:    Worry: Not on file    Inability: Not on file  . Transportation needs:    Medical: Not on file    Non-medical: Not on file  Tobacco Use  . Smoking status: Current Every Day Smoker    Packs/day: 0.50    Types: Cigarettes  . Smokeless tobacco: Never Used  Substance and Sexual Activity  . Alcohol use:  No    Alcohol/week: 0.0 oz  . Drug use: No  . Sexual activity: Yes  Lifestyle  . Physical activity:    Days per week: Not on file    Minutes per session: Not on file  . Stress: Not on file  Relationships  . Social connections:    Talks on phone: Not on file    Gets together: Not on file    Attends religious service: Not on file    Active member of club or organization: Not on file    Attends meetings of clubs or organizations: Not on file    Relationship status: Not on file  . Intimate partner violence:    Fear of current or ex partner: Not on file    Emotionally abused: Not on file    Physically abused: Not on file    Forced sexual activity: Not on file  Other Topics Concern  . Not on file  Social History Narrative  . Not on file    Review of Systems: See HPI, otherwise negative ROS  Physical Exam: Pulse 65   Temp (!) 96.9 F (36.1 C)   Resp 18   SpO2 100%  General:   Alert,  pleasant and cooperative in NAD Head:  Normocephalic and atraumatic. Neck:  Supple; no masses  or thyromegaly. Lungs:  Clear throughout to auscultation.    Heart:  Regular rate and rhythm. Abdomen:  Soft, nontender and nondistended. Normal bowel sounds, without guarding, and without rebound.   Neurologic:  Alert and  oriented x4;  grossly normal neurologically.  Impression/Plan: Nathan Mack is here for an endoscopy and colonoscopy to be performed for EGD and colonoscopy  Risks, benefits, limitations, and alternatives regarding  endoscopy and colonoscopy have been reviewed with the patient.  Questions have been answered.  All parties agreeable.   Lucilla Lame, MD  09/09/2017, 8:54 AM

## 2017-09-09 NOTE — Op Note (Signed)
[facility] Gastroenterology Patient Name: Nathan Mack Procedure Date: 09/09/2017 9:41 AM MRN: [id number] Account #: [id number] Date of Birth: January 05, 1956 Admit Type: Outpatient Age: 62 Room: [facility] ENDO ROOM 4 Gender: Male Note Status: Finalized Procedure:            Upper GI endoscopy Indications:          Iron deficiency anemia Providers:            Lucilla Lame MD, MD Referring MD:         Arnetha Courser (Referring MD) Medicines:            Propofol per Anesthesia Complications:        No immediate complications. Procedure:            Pre-Anesthesia Assessment:                       - Prior to the procedure, a History and Physical was                        performed, and patient medications and allergies were                        reviewed. The patient's tolerance of previous                        anesthesia was also reviewed. The risks and benefits of                        the procedure and the sedation options and risks were                        discussed with the patient. All questions were                        answered, and informed consent was obtained. Prior                        Anticoagulants: The patient has taken no previous                        anticoagulant or antiplatelet agents. ASA Grade                        Assessment: II - A patient with mild systemic disease.                        After reviewing the risks and benefits, the patient was                        deemed in satisfactory condition to undergo the                        procedure.                       After obtaining informed consent, the endoscope was                        passed under direct vision. Throughout the procedure,  the patient's blood pressure, pulse, and oxygen                        saturations were monitored continuously. The Endoscope                        was introduced through the mouth, and advanced to the   second part of duodenum. The upper GI endoscopy was                        accomplished without difficulty. The patient tolerated                        the procedure well. Findings:      The examined esophagus was normal.      Diffuse moderate inflammation characterized by erythema and granularity       was found in the entire examined stomach. Biopsies were taken with a       cold forceps for histology.      Localized moderately erythematous mucosa without active bleeding and       with no stigmata of bleeding was found in the duodenal bulb. Impression:           - Normal esophagus.                       - Gastritis. Biopsied.                       - Erythematous duodenopathy. Recommendation:       - Discharge patient to home.                       - Resume previous diet.                       - Continue present medications.                       - Await pathology results.                       - Perform a colonoscopy today. Procedure Code(s):    --- Professional ---                       [phone], Esophagogastroduodenoscopy, flexible, transoral;                        with biopsy, single or multiple Diagnosis Code(s):    --- Professional ---                       D50.9, Iron deficiency anemia, unspecified                       K29.70, Gastritis, unspecified, without bleeding                       K31.89, Other diseases of stomach and duodenum CPT copyright 2017 American Medical Association. All rights reserved. The codes documented in this report are preliminary and upon coder review may  be revised to meet current compliance requirements. Lucilla Lame MD, MD 09/09/2017 9:51:01 AM This report has been signed electronically. Number of Addenda: 0 Note Initiated On:  09/09/2017 9:41 AM      [facility]

## 2017-09-09 NOTE — Transfer of Care (Signed)
Immediate Anesthesia Transfer of Care Note  Patient: Nathan Mack  Procedure(s) Performed: COLONOSCOPY WITH PROPOFOL (N/A ) ESOPHAGOGASTRODUODENOSCOPY (EGD) WITH PROPOFOL (N/A )  Patient Location: PACU and Endoscopy Unit  Anesthesia Type:General  Level of Consciousness: drowsy and patient cooperative  Airway & Oxygen Therapy: Patient Spontanous Breathing  Post-op Assessment: Report given to RN, Post -op Vital signs reviewed and stable and Patient moving all extremities  Post vital signs: Reviewed and stable  Last Vitals:  Vitals Value Taken Time  BP 102/66 09/09/2017 10:19 AM  Temp 36.1 C 09/09/2017 10:19 AM  Pulse 49 09/09/2017 10:19 AM  Resp    SpO2 99 % 09/09/2017 10:19 AM    Last Pain:  Vitals:   09/09/17 1019  TempSrc:   PainSc: Asleep      Patients Stated Pain Goal: 0 (04/14/81 5003)  Complications: No apparent anesthesia complications

## 2017-09-09 NOTE — Anesthesia Preprocedure Evaluation (Signed)
Anesthesia Evaluation  Patient identified by MRN, date of birth, ID band Patient awake    Reviewed: Allergy & Precautions, H&P , NPO status , Patient's Chart, lab work & pertinent test results, reviewed documented beta blocker date and time   History of Anesthesia Complications Negative for: history of anesthetic complications  Airway Mallampati: I  TM Distance: >3 FB Neck ROM: full    Dental  (+) Edentulous Upper, Edentulous Lower   Pulmonary neg shortness of breath, neg COPD, neg recent URI, Current Smoker,           Cardiovascular Exercise Tolerance: Good negative cardio ROS       Neuro/Psych Seizures -,  negative psych ROS   GI/Hepatic negative GI ROS, Neg liver ROS,   Endo/Other  negative endocrine ROS  Renal/GU negative Renal ROS  negative genitourinary   Musculoskeletal   Abdominal   Peds  Hematology negative hematology ROS (+)   Anesthesia Other Findings History reviewed. No pertinent past medical history.   Reproductive/Obstetrics negative OB ROS                             Anesthesia Physical Anesthesia Plan  ASA: II  Anesthesia Plan: General   Post-op Pain Management:    Induction: Intravenous  PONV Risk Score and Plan: 1 and Propofol infusion  Airway Management Planned: Natural Airway and Nasal Cannula  Additional Equipment:   Intra-op Plan:   Post-operative Plan:   Informed Consent: I have reviewed the patients History and Physical, chart, labs and discussed the procedure including the risks, benefits and alternatives for the proposed anesthesia with the patient or authorized representative who has indicated his/her understanding and acceptance.   Dental Advisory Given  Plan Discussed with: Anesthesiologist, CRNA and Surgeon  Anesthesia Plan Comments:         Anesthesia Quick Evaluation

## 2017-09-10 LAB — SURGICAL PATHOLOGY

## 2017-09-11 ENCOUNTER — Encounter: Payer: Self-pay | Admitting: Gastroenterology

## 2017-09-16 ENCOUNTER — Encounter: Payer: Self-pay | Admitting: Gastroenterology

## 2017-10-10 ENCOUNTER — Other Ambulatory Visit: Payer: Self-pay

## 2017-10-13 ENCOUNTER — Other Ambulatory Visit: Payer: Self-pay

## 2017-10-13 MED ORDER — CLARITHROMYCIN 500 MG PO TABS: 500.0000 mg | ORAL_TABLET | Freq: Two times a day (BID) | ORAL | 0 refills | Status: DC

## 2017-10-13 MED ORDER — AMOXICILLIN 500 MG PO CAPS: 1000.0000 mg | ORAL_CAPSULE | Freq: Two times a day (BID) | ORAL | 0 refills | Status: DC

## 2018-01-10 ENCOUNTER — Other Ambulatory Visit: Payer: Self-pay | Admitting: Family Medicine

## 2018-01-10 DIAGNOSIS — Z5181 Encounter for therapeutic drug level monitoring: Secondary | ICD-10-CM

## 2018-01-10 DIAGNOSIS — G40409 Other generalized epilepsy and epileptic syndromes, not intractable, without status epilepticus: Secondary | ICD-10-CM

## 2018-01-10 MED ORDER — PHENYTOIN SODIUM EXTENDED 100 MG PO CAPS: 200.0000 mg | ORAL_CAPSULE | Freq: Two times a day (BID) | ORAL | 0 refills | Status: DC

## 2018-01-10 NOTE — Telephone Encounter (Signed)
Please remind patient that we had hoped to recheck his phenytoin (Dilantin) level in July I'll send limited supply locally; thank you

## 2018-01-12 ENCOUNTER — Other Ambulatory Visit: Payer: Self-pay

## 2018-01-12 DIAGNOSIS — Z5181 Encounter for therapeutic drug level monitoring: Secondary | ICD-10-CM

## 2018-02-06 ENCOUNTER — Ambulatory Visit (INDEPENDENT_AMBULATORY_CARE_PROVIDER_SITE_OTHER): Payer: Managed Care, Other (non HMO) | Admitting: Family Medicine

## 2018-02-06 ENCOUNTER — Encounter: Payer: Self-pay | Admitting: Family Medicine

## 2018-02-06 VITALS — BP 110/62 | HR 75 | Temp 98.0°F | Ht 66.0 in | Wt 150.0 lb

## 2018-02-06 DIAGNOSIS — Z23 Encounter for immunization: Secondary | ICD-10-CM | POA: Diagnosis not present

## 2018-02-06 DIAGNOSIS — Z125 Encounter for screening for malignant neoplasm of prostate: Secondary | ICD-10-CM | POA: Diagnosis not present

## 2018-02-06 DIAGNOSIS — Z5181 Encounter for therapeutic drug level monitoring: Secondary | ICD-10-CM

## 2018-02-06 DIAGNOSIS — M954 Acquired deformity of chest and rib: Secondary | ICD-10-CM

## 2018-02-06 DIAGNOSIS — G40409 Other generalized epilepsy and epileptic syndromes, not intractable, without status epilepticus: Secondary | ICD-10-CM | POA: Diagnosis not present

## 2018-02-06 DIAGNOSIS — Z716 Tobacco abuse counseling: Secondary | ICD-10-CM

## 2018-02-06 DIAGNOSIS — J449 Chronic obstructive pulmonary disease, unspecified: Secondary | ICD-10-CM | POA: Diagnosis not present

## 2018-02-06 MED ORDER — PHENYTOIN SODIUM EXTENDED 100 MG PO CAPS: 200.0000 mg | ORAL_CAPSULE | Freq: Two times a day (BID) | ORAL | 0 refills | Status: DC

## 2018-02-06 NOTE — Progress Notes (Signed)
BP 110/62   Pulse 75   Temp 98 F (36.7 C) (Oral)   Ht 5\' 6"  (1.676 m)   Wt 150 lb (68 kg)   SpO2 97%   BMI 24.21 kg/m    Subjective:    Patient ID: Nathan Mack, male    DOB: [birth year], 62 y.o.   MRN: [id number]  HPI: Nathan Mack is a 62 y.o. male  Chief Complaint  Patient presents with  . Follow-up    HPI He is here for f/u  Epilepsy; last seizure was a long time ago; last one was 109; they have talked about stopping this medicine, but they just talk about it; he wants to hold on the bone density because of money; he wants to onl get necessary labs because of the cost  Recent H pylori; took the antibiotics; no abdominal pain; no blood in the stool  Smoking; about 1/2 to 3/4 ppd; thinking about quitting, expensive Awakens at 3:30 am; takes his shower, feeds the dog, then 3:50 am before first cigarettes Stress is a trigger; thinking about buying a house and dealing with the bank is a stressor  Chest is getting bigger; not exercising; shape of ribcage is changing; trying to exercise and push his arms back and it's pushing things forward; feels fine with no pain  Depression screen [facility] 2/9 02/06/2018 08/06/2017 06/06/2017 03/06/2017 02/18/2017  Decreased Interest 0 0 0 0 0  Down, Depressed, Hopeless 0 0 0 0 0  PHQ - 2 Score 0 0 0 0 0  Altered sleeping 0 - - - -  Tired, decreased energy 0 - - - -  Change in appetite 0 - - - -  Feeling bad or failure about yourself  0 - - - -  Trouble concentrating 0 - - - -  Moving slowly or fidgety/restless 0 - - - -  Suicidal thoughts 0 - - - -  PHQ-9 Score 0 - - - -  Difficult doing work/chores Not difficult at all - - - -   Fall Risk  02/06/2018 08/06/2017 06/06/2017 03/06/2017 02/18/2017  Falls in the past year? No No No No No    Relevant past medical, surgical, family and social history reviewed History reviewed. No pertinent past medical history. Past Surgical History:  Procedure Laterality Date  . COLONOSCOPY WITH PROPOFOL  N/A 09/09/2017   Procedure: COLONOSCOPY WITH PROPOFOL;  Surgeon: Lucilla Lame, MD;  Location: [facility] ENDOSCOPY;  Service: Endoscopy;  Laterality: N/A;  . ESOPHAGOGASTRODUODENOSCOPY (EGD) WITH PROPOFOL N/A 09/09/2017   Procedure: ESOPHAGOGASTRODUODENOSCOPY (EGD) WITH PROPOFOL;  Surgeon: Lucilla Lame, MD;  Location: [facility] ENDOSCOPY;  Service: Endoscopy;  Laterality: N/A;  . LUMBAR DISC SURGERY     Family History  Problem Relation Age of Onset  . Cancer Mother        colon   Social History   Tobacco Use  . Smoking status: Current Every Day Smoker    Packs/day: 0.50    Types: Cigarettes  . Smokeless tobacco: Never Used  Substance Use Topics  . Alcohol use: No    Alcohol/week: 0.0 standard drinks  . Drug use: No     Office Visit from 02/06/2018 in [facility]  AUDIT-C Score  0      Interim medical history since last visit reviewed. Allergies and medications reviewed  Review of Systems Per HPI unless specifically indicated above     Objective:    BP 110/62   Pulse 75   Temp 98 F (36.7  C) (Oral)   Ht 5\' 6"  (1.676 m)   Wt 150 lb (68 kg)   SpO2 97%   BMI 24.21 kg/m   Wt Readings from Last 3 Encounters:  02/06/18 150 lb (68 kg)  08/28/17 153 lb (69.4 kg)  08/06/17 151 lb 1.6 oz (68.5 kg)    Physical Exam  Constitutional: He appears well-developed and well-nourished. No distress.  HENT:  Head: Normocephalic and atraumatic.  Eyes: EOM are normal. No scleral icterus.  Neck: No thyromegaly present.  Cardiovascular: Normal rate and regular rhythm.  Pulmonary/Chest: Effort normal and breath sounds normal.  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 Bowel sounds are normal. He exhibits no distension.  Musculoskeletal: He exhibits no edema.  Neurological: Coordination normal.  Skin: Skin is warm and dry. No pallor.  Psychiatric: He has a normal mood and affect. His behavior is normal. Judgment and thought content normal. His mood  appears not anxious. He does not exhibit a depressed mood.    Results for orders placed or performed during the hospital encounter of 09/09/17  Surgical pathology  Result Value Ref Range   SURGICAL PATHOLOGY      Surgical Pathology CASE: [id number] PATIENT: Nathan Mack Surgical Pathology Report     SPECIMEN SUBMITTED: A. Stomach; cbxs B. Colon polyp x2, ascending; cold snare C. Colon polyp, transverse; cold snare D. Colon polyp, descending; hot snare E. Colon polyp, descending; hot snare  CLINICAL HISTORY: None provided  PRE-OPERATIVE DIAGNOSIS: Iron deficiency anemia D50.0  POST-OPERATIVE DIAGNOSIS: Duodenitis, gastritis, colon polyps     DIAGNOSIS: A.  STOMACH; COLD BIOPSY: - HELICOBACTER PYLORI-ASSOCIATED GASTRITIS WITH MODERATE CHRONIC ACTIVE INFLAMMATION. - NEGATIVE FOR INTESTINAL METAPLASIA, ATROPHY, DYSPLASIA, AND MALIGNANCY. - H. PYLORI BACTERIA IDENTIFIED IN HEMATOXYLIN AND EOSIN SECTIONS.  B.  COLON POLYP X 2, ASCENDING; COLD SNARE: - TUBULAR ADENOMAS, MULTIPLE FRAGMENTS. - NEGATIVE FOR HIGH-GRADE DYSPLASIA AND MALIGNANCY.  C.  COLON POLYP, TRANSVERSE; COLD SNARE: - TUBULAR ADENOMA, 3 FRAGMENTS. - NEGATIVE FOR HIGH-GRADE DYSPLASIA AND MALIGNA NCY.  D. COLON POLYP, DESCENDING; HOT SNARE: - TUBULOVILLOUS ADENOMA, 1.4 CM. - NEGATIVE FOR HIGH-GRADE DYSPLASIA AND MALIGNANCY.  E.  COLON POLYP, DESCENDING; HOT SNARE: - PEDUNCULATED TUBULAR ADENOMA, 2.8 CM. - STALK MEASURES AT LEAST 0.5 CM, AND MARGIN APPEARS CLEAR. - NEGATIVE FOR HIGH-GRADE DYSPLASIA AND MALIGNANCY.   GROSS DESCRIPTION: A. Labeled: Gastric cbx Received: In formalin Tissue fragment(s): 2 Size: 0.4 and 0.5 cm Description: Tan fragments Entirely submitted in one cassette.  B. Labeled: Ascending colon polyp cold snare x2 Received: In formalin Tissue fragment(s): Multiple size: Aggregate, 1.4 x 0.5 x 0.1 cm Description: Tan fragments Entirely submitted in one cassette.  C.  Labeled: Transverse colon polyp cold snare Received: In formalin Tissue fragment(s): Multiple Size: Aggregate, 1.8 x 0.3 x 0.1 cm Description: Pink-Tan fragments and fecal material, the largest inked blue and bisected Entirely submitted in one cassette.  D. Labeled:  Descending colon polyp hot snare Received: In formalin Tissue fragment(s): 1 Size: 1.4 x 1.0 x 0.6 cm Description: Pink-Tan fragment, inked green and area suggestive of base and trisected Entirely submitted in one cassette.  E. Labeled: Descending colon polyp hot snare Received: In formalin Tissue fragment(s): 1 Size: 2.8 x 2.2 x 1.8 cm Description: Purple pedunculated polyp, inked black at the base and sectioned Entirely submitted in four cassettes.   Final Diagnosis performed by Bryan Lemma, MD.   Electronically signed 09/10/2017 6:51:07PM The electronic signature indicates that the named Attending Pathologist has evaluated the specimen  Technical component performed at [facility], [address], [place], [place] [address] Lab: [phone] Dir: Rush Farmer, MD, MMM  Professional component performed at [facility], [facility], [place], [place], [place] [address] Lab: [phone] Dir: Dellia Nims. Rubinas, MD       Assessment & Plan:   Problem List Items Addressed This Visit      Respiratory   COPD (chronic obstructive pulmonary disease) ([place])    Recommended pneumonia vaccine PPSV-23; he will get PCV-13 at age 35, then another PPSV-23 five years after this one today; smoking cessation stressed      Relevant Orders   Pneumococcal polysaccharide vaccine 23-valent greater than or equal to 2yo subcutaneous/IM (Completed)     Nervous and Auditory   Seizure disorder, grand mal ([place]) - Primary    No recent seizures; check level in November with other labs      Relevant Medications   phenytoin (DILANTIN) 100 MG ER capsule   Other Relevant Orders   Dilantin (Phenytoin) level, total      Other   Tobacco abuse counseling    Patient is thinking about quitting; I talked with him for 3 minutes about smoking habits, cessation; see AVS; I am here to help       Other Visit Diagnoses    Therapeutic drug monitoring       Relevant Medications   phenytoin (DILANTIN) 100 MG ER capsule   Other Relevant Orders   Dilantin (Phenytoin) level, total   Prostate cancer screening       Relevant Orders   PSA   Therapeutic drug monitoring       check dilantin level   Relevant Medications   phenytoin (DILANTIN) 100 MG ER capsule   Other Relevant Orders   Dilantin (Phenytoin) level, total   Barrel chest       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 Sanda Klein.  An after-visit summary was printed and given to the patient at [facility].  Please see the patient instructions which may contain other information and recommendations beyond what is mentioned above in the assessment and plan.  Meds ordered this encounter  Medications  . phenytoin (DILANTIN) 100 MG ER capsule    Sig: Take 2 capsules (200 mg total) by mouth 2 (two) times daily.    Dispense:  180 capsule    Refill:  0    Orders Placed This Encounter  Procedures  . Pneumococcal polysaccharide vaccine 23-valent greater than or equal to 2yo subcutaneous/IM  . Dilantin (Phenytoin) level, total  . PSA

## 2018-02-06 NOTE — Patient Instructions (Addendum)
I do encourage you to quit smoking Call [phone] to sign up for smoking cessation classes You can call 1-800-QUIT-NOW to talk with a smoking cessation coach Return around November 18th or later that week for NON-fasting labs   Steps to Quit Smoking Smoking tobacco can be bad for your health. It can also affect almost every organ in your body. Smoking puts you and people around you at risk for many serious long-lasting (chronic) diseases. Quitting smoking is hard, but it is one of the best things that you can do for your health. It is never too late to quit. What are the benefits of quitting smoking? When you quit smoking, you lower your risk for getting serious diseases and conditions. They can include:  Lung cancer or lung disease.  Heart disease.  Stroke.  Heart attack.  Not being able to have children (infertility).  Weak bones (osteoporosis) and broken bones (fractures).  If you have coughing, wheezing, and shortness of breath, those symptoms may get better when you quit. You may also get sick less often. If you are pregnant, quitting smoking can help to lower your chances of having a baby of low birth weight. What can I do to help me quit smoking? Talk with your doctor about what can help you quit smoking. Some things you can do (strategies) include:  Quitting smoking totally, instead of slowly cutting back how much you smoke over a period of time.  Going to in-person counseling. You are more likely to quit if you go to many counseling sessions.  Using resources and support systems, such as: ? Database administrator with a Social worker. ? Phone quitlines. ? Careers information officer. ? Support groups or group counseling. ? Text messaging programs. ? Mobile phone apps or applications.  Taking medicines. Some of these medicines may have nicotine in them. If you are pregnant or breastfeeding, do not take any medicines to quit smoking unless your doctor says it is okay. Talk with  your doctor about counseling or other things that can help you.  Talk with your doctor about using more than one strategy at the same time, such as taking medicines while you are also going to in-person counseling. This can help make quitting easier. What things can I do to make it easier to quit? Quitting smoking might feel very hard at first, but there is a lot that you can do to make it easier. Take these steps:  Talk to your family and friends. Ask them to support and encourage you.  Call phone quitlines, reach out to support groups, or work with a Social worker.  Ask people who smoke to not smoke around you.  Avoid places that make you want (trigger) to smoke, such as: ? Bars. ? Parties. ? Smoke-break areas at work.  Spend time with people who do not smoke.  Lower the stress in your life. Stress can make you want to smoke. Try these things to help your stress: ? Getting regular exercise. ? Deep-breathing exercises. ? Yoga. ? Meditating. ? Doing a body scan. To do this, close your eyes, focus on one area of your body at a time from head to toe, and notice which parts of your body are tense. Try to relax the muscles in those areas.  Download or buy apps on your mobile phone or tablet that can help you stick to your quit plan. There are many free apps, such as QuitGuide from the [employer] Office manager for Disease Control and Prevention). You can find more  support from smokefree.gov and other websites.  This information is not intended to replace advice given to you by your health care provider. Make sure you discuss any questions you have with your health care provider. Document Released: 02/02/2009 Document Revised: 12/05/2015 Document Reviewed: 08/23/2014 Elsevier Interactive Patient Education  2018 [employer].

## 2018-02-06 NOTE — Assessment & Plan Note (Signed)
No recent seizures; check level in November with other labs

## 2018-02-06 NOTE — Assessment & Plan Note (Signed)
Recommended pneumonia vaccine PPSV-23; he will get PCV-13 at age 62, then another PPSV-23 five years after this one today; smoking cessation stressed

## 2018-02-06 NOTE — Assessment & Plan Note (Signed)
Patient is thinking about quitting; I talked with him for 3 minutes about smoking habits, cessation; see AVS; I am here to help

## 2018-03-26 ENCOUNTER — Other Ambulatory Visit: Payer: Self-pay | Admitting: Family Medicine

## 2018-03-26 DIAGNOSIS — G40409 Other generalized epilepsy and epileptic syndromes, not intractable, without status epilepticus: Secondary | ICD-10-CM

## 2018-03-26 DIAGNOSIS — Z5181 Encounter for therapeutic drug level monitoring: Secondary | ICD-10-CM

## 2018-03-26 NOTE — Telephone Encounter (Signed)
Please ask patient to get the labs done that we had hoped he would get earlier Dilantin level and PSA, already ordered I'll send in one week of medicine to local pharmacy (find out which one he wants, because there are two local pharmacies listed), but we really need to make sure his level is good before I send in a 90 day supply Thank you

## 2018-03-27 MED ORDER — PHENYTOIN SODIUM EXTENDED 100 MG PO CAPS: 200.0000 mg | ORAL_CAPSULE | Freq: Two times a day (BID) | ORAL | 0 refills | Status: DC

## 2018-03-27 NOTE — Telephone Encounter (Signed)
I still just need to know which local pharmacy he wants a week of dilantin sent to Thank you

## 2018-03-27 NOTE — Telephone Encounter (Signed)
Thank you for trying to reach him I found that a recent antibiotic was sent to [facility] [address] by another provider, so I sent the Rx there

## 2018-03-27 NOTE — Telephone Encounter (Signed)
Left detailed voicemail

## 2018-03-27 NOTE — Telephone Encounter (Signed)
Left another voice mail

## 2018-04-01 ENCOUNTER — Other Ambulatory Visit: Payer: Self-pay | Admitting: Family Medicine

## 2018-04-01 DIAGNOSIS — G40409 Other generalized epilepsy and epileptic syndromes, not intractable, without status epilepticus: Secondary | ICD-10-CM

## 2018-04-01 DIAGNOSIS — Z5181 Encounter for therapeutic drug level monitoring: Secondary | ICD-10-CM

## 2018-04-01 LAB — PHENYTOIN LEVEL, TOTAL: Phenytoin, Total: 17.9 mg/L (ref 10.0–20.0)

## 2018-04-01 MED ORDER — PHENYTOIN SODIUM EXTENDED 100 MG PO CAPS: 200.0000 mg | ORAL_CAPSULE | Freq: Two times a day (BID) | ORAL | 6 refills | Status: DC

## 2018-04-01 NOTE — Progress Notes (Signed)
Joelene Millin, please let the patient know that his phenytoin level is fine; continue medicine the same and I'll send in plenty of refills (I sent 30 + 6 to local pharmacy; if he wants mail order, just tee that up and I'll send too)

## 2018-04-01 NOTE — Progress Notes (Signed)
Refills sent to local pharmacy

## 2018-04-03 ENCOUNTER — Other Ambulatory Visit: Payer: Self-pay

## 2018-04-03 DIAGNOSIS — G40409 Other generalized epilepsy and epileptic syndromes, not intractable, without status epilepticus: Secondary | ICD-10-CM

## 2018-04-03 DIAGNOSIS — Z5181 Encounter for therapeutic drug level monitoring: Secondary | ICD-10-CM

## 2018-04-03 NOTE — Telephone Encounter (Signed)
-----   Message from Arnetha Courser, MD sent at 04/01/2018 11:20 AM EST ----- Joelene Millin, please let the patient know that his phenytoin level is fine; continue medicine the same and I'll send in plenty of refills (I sent 30 + 6 to local pharmacy; if he wants mail order, just tee that up and I'll send too)

## 2018-04-03 NOTE — Telephone Encounter (Signed)
Pt would [facility]s sent [facility]:)

## 2018-04-04 MED ORDER — PHENYTOIN SODIUM EXTENDED 100 MG PO CAPS: 200.0000 mg | ORAL_CAPSULE | Freq: Two times a day (BID) | ORAL | 1 refills | Status: DC

## 2018-04-04 NOTE — Telephone Encounter (Signed)
Thank you, Rx sent

## 2018-08-13 ENCOUNTER — Ambulatory Visit: Payer: Self-pay | Admitting: Family Medicine

## 2018-08-13 ENCOUNTER — Other Ambulatory Visit: Payer: Self-pay

## 2018-08-13 ENCOUNTER — Observation Stay
Admission: EM | Admit: 2018-08-13 | Discharge: 2018-08-14 | Disposition: A | Discharge status: Home / Self Care | Payer: Managed Care, Other (non HMO) | Attending: Internal Medicine | Admitting: Internal Medicine

## 2018-08-13 ENCOUNTER — Encounter: Payer: Self-pay | Admitting: Emergency Medicine

## 2018-08-13 ENCOUNTER — Emergency Department: Payer: Managed Care, Other (non HMO)

## 2018-08-13 DIAGNOSIS — G40409 Other generalized epilepsy and epileptic syndromes, not intractable, without status epilepticus: Secondary | ICD-10-CM | POA: Diagnosis not present

## 2018-08-13 DIAGNOSIS — Z5181 Encounter for therapeutic drug level monitoring: Secondary | ICD-10-CM

## 2018-08-13 DIAGNOSIS — Z800 Family history of malignant neoplasm of digestive organs: Secondary | ICD-10-CM | POA: Insufficient documentation

## 2018-08-13 DIAGNOSIS — Z79899 Other long term (current) drug therapy: Secondary | ICD-10-CM | POA: Insufficient documentation

## 2018-08-13 DIAGNOSIS — R531 Weakness: Principal | ICD-10-CM | POA: Insufficient documentation

## 2018-08-13 DIAGNOSIS — Z23 Encounter for immunization: Secondary | ICD-10-CM | POA: Diagnosis not present

## 2018-08-13 DIAGNOSIS — T420X5A Adverse effect of hydantoin derivatives, initial encounter: Secondary | ICD-10-CM | POA: Diagnosis not present

## 2018-08-13 DIAGNOSIS — I4891 Unspecified atrial fibrillation: Secondary | ICD-10-CM | POA: Diagnosis not present

## 2018-08-13 DIAGNOSIS — T420X4A Poisoning by hydantoin derivatives, undetermined, initial encounter: Secondary | ICD-10-CM

## 2018-08-13 DIAGNOSIS — F17210 Nicotine dependence, cigarettes, uncomplicated: Secondary | ICD-10-CM | POA: Insufficient documentation

## 2018-08-13 DIAGNOSIS — T420X1A Poisoning by hydantoin derivatives, accidental (unintentional), initial encounter: Secondary | ICD-10-CM | POA: Diagnosis present

## 2018-08-13 DIAGNOSIS — J449 Chronic obstructive pulmonary disease, unspecified: Secondary | ICD-10-CM | POA: Diagnosis not present

## 2018-08-13 HISTORY — DX: Chronic obstructive pulmonary disease, unspecified: J44.9

## 2018-08-13 LAB — CBC WITH DIFFERENTIAL/PLATELET
Abs Immature Granulocytes: 0.01 10*3/uL (ref 0.00–0.07)
Basophils Absolute: 0.1 10*3/uL (ref 0.0–0.1)
Basophils Relative: 1 %
Eosinophils Absolute: 0.2 10*3/uL (ref 0.0–0.5)
Eosinophils Relative: 3 %
HCT: 37.2 % — ABNORMAL LOW (ref 39.0–52.0)
Hemoglobin: 13 g/dL (ref 13.0–17.0)
Immature Granulocytes: 0 %
Lymphocytes Relative: 39 %
Lymphs Abs: 2.4 10*3/uL (ref 0.7–4.0)
MCH: 33.9 pg (ref 26.0–34.0)
MCHC: 34.9 g/dL (ref 30.0–36.0)
MCV: 96.9 fL (ref 80.0–100.0)
Monocytes Absolute: 0.3 10*3/uL (ref 0.1–1.0)
Monocytes Relative: 5 %
Neutro Abs: 3.2 10*3/uL (ref 1.7–7.7)
Neutrophils Relative %: 52 %
Platelets: 282 10*3/uL (ref 150–400)
RBC: 3.84 MIL/uL — ABNORMAL LOW (ref 4.22–5.81)
RDW: 12.8 % (ref 11.5–15.5)
WBC: 6.2 10*3/uL (ref 4.0–10.5)
nRBC: 0 % (ref 0.0–0.2)

## 2018-08-13 LAB — COMPREHENSIVE METABOLIC PANEL
ALT: 19 U/L (ref 0–44)
AST: 19 U/L (ref 15–41)
Albumin: 4.3 g/dL (ref 3.5–5.0)
Alkaline Phosphatase: 99 U/L (ref 38–126)
Anion gap: 9 (ref 5–15)
BUN: 19 mg/dL (ref 8–23)
CO2: 24 mmol/L (ref 22–32)
Calcium: 9.1 mg/dL (ref 8.9–10.3)
Chloride: 105 mmol/L (ref 98–111)
Creatinine, Ser: 0.78 mg/dL (ref 0.61–1.24)
GFR calc Af Amer: >60 mL/min (ref >60)
GFR calc non Af Amer: >60 mL/min (ref >60)
Glucose, Bld: 104 mg/dL — ABNORMAL HIGH (ref 70–99)
Potassium: 3.9 mmol/L (ref 3.5–5.1)
Sodium: 138 mmol/L (ref 135–145)
Total Bilirubin: 0.6 mg/dL (ref 0.3–1.2)
Total Protein: 7.8 g/dL (ref 6.5–8.1)

## 2018-08-13 LAB — ETHANOL: Alcohol, Ethyl (B): <10 mg/dL (ref <10)

## 2018-08-13 LAB — PHENYTOIN LEVEL, TOTAL: Phenytoin Lvl: 31.7 ug/mL (ref 10.0–20.0)

## 2018-08-13 LAB — URINALYSIS, COMPLETE (UACMP) WITH MICROSCOPIC
Bacteria, UA: NONE SEEN
Bilirubin Urine: NEGATIVE
Glucose, UA: NEGATIVE mg/dL
Ketones, ur: NEGATIVE mg/dL
Leukocytes,Ua: NEGATIVE
Nitrite: NEGATIVE
Protein, ur: NEGATIVE mg/dL
Specific Gravity, Urine: 1.006 (ref 1.005–1.030)
Squamous Epithelial / HPF: NONE SEEN (ref 0–5)
pH: 6 (ref 5.0–8.0)

## 2018-08-13 LAB — GLUCOSE, CAPILLARY: Glucose-Capillary: 88 mg/dL (ref 70–99)

## 2018-08-13 LAB — LACTIC ACID, PLASMA: Lactic Acid, Venous: 1 mmol/L (ref 0.5–1.9)

## 2018-08-13 LAB — TROPONIN I: Troponin I: <0.03 ng/mL (ref <0.03)

## 2018-08-13 MED ORDER — LACTATED RINGERS IV SOLN
INTRAVENOUS | Status: AC
  Administered 2018-08-13 – 2018-08-14 (×2): via INTRAVENOUS

## 2018-08-13 MED ORDER — SODIUM CHLORIDE 0.9% FLUSH: 3.0000 mL | Freq: Two times a day (BID) | INTRAVENOUS | Status: DC

## 2018-08-13 MED ORDER — POLYETHYLENE GLYCOL 3350 17 G PO PACK: 17.0000 g | PACK | Freq: Every day | ORAL | Status: DC | PRN

## 2018-08-13 MED ORDER — ONDANSETRON HCL 4 MG PO TABS: 4.0000 mg | ORAL_TABLET | Freq: Four times a day (QID) | ORAL | Status: DC | PRN

## 2018-08-13 MED ORDER — PNEUMOCOCCAL VAC POLYVALENT 25 MCG/0.5ML IJ INJ
0.5000 mL | INJECTION | INTRAMUSCULAR | Status: AC
  Administered 2018-08-14: 0.5 mL via INTRAMUSCULAR
  Filled 2018-08-13: qty 0.5

## 2018-08-13 MED ORDER — ACETAMINOPHEN 650 MG RE SUPP: 650.0000 mg | Freq: Four times a day (QID) | RECTAL | Status: DC | PRN

## 2018-08-13 MED ORDER — ONDANSETRON HCL 4 MG/2ML IJ SOLN: 4.0000 mg | Freq: Four times a day (QID) | INTRAMUSCULAR | Status: DC | PRN

## 2018-08-13 MED ORDER — ENOXAPARIN SODIUM 40 MG/0.4ML ~~LOC~~ SOLN
40.0000 mg | SUBCUTANEOUS | Status: DC
  Administered 2018-08-13: 21:00:00 40 mg via SUBCUTANEOUS
  Filled 2018-08-13: qty 0.4

## 2018-08-13 MED ORDER — FOLIC ACID 1 MG PO TABS
1000.0000 ug | ORAL_TABLET | Freq: Every day | ORAL | Status: DC
  Administered 2018-08-14: 10:00:00 1 mg via ORAL
  Filled 2018-08-13: qty 1

## 2018-08-13 MED ORDER — ALBUTEROL SULFATE (2.5 MG/3ML) 0.083% IN NEBU: 2.5000 mg | INHALATION_SOLUTION | RESPIRATORY_TRACT | Status: DC | PRN

## 2018-08-13 MED ORDER — SODIUM CHLORIDE 0.9 % IV BOLUS
500.0000 mL | Freq: Once | INTRAVENOUS | Status: AC
  Administered 2018-08-13: 500 mL via INTRAVENOUS

## 2018-08-13 MED ORDER — ACETAMINOPHEN 325 MG PO TABS: 650.0000 mg | ORAL_TABLET | Freq: Four times a day (QID) | ORAL | Status: DC | PRN

## 2018-08-13 MED ORDER — VITAMIN B-12 1000 MCG PO TABS
1000.0000 ug | ORAL_TABLET | Freq: Every day | ORAL | Status: DC
  Administered 2018-08-14: 10:00:00 1000 ug via ORAL
  Filled 2018-08-13: qty 1

## 2018-08-13 NOTE — ED Notes (Signed)
Dr. Burlene Arnt notified of critical Dilantin level of 31.7

## 2018-08-13 NOTE — Telephone Encounter (Signed)
Thank you Yes, ER appropriate for evaluation urgently

## 2018-08-13 NOTE — H&P (Signed)
[facility] [place] NAME: Nathan Mack    MR#:  [id number]  DATE OF BIRTH:  12-27-1955  DATE OF ADMISSION:  08/13/2018  PRIMARY CARE PHYSICIAN: Arnetha Courser, MD   REQUESTING/REFERRING PHYSICIAN: Dr. Burlene Arnt  CHIEF COMPLAINT:   Chief Complaint  Patient presents with  . Dizziness  . Eye Drainage  . Weakness    HISTORY OF PRESENT ILLNESS:  Nathan Mack  is a 63 y.o. male with a known history of seizures, COPD presented to the hospital due to gait abnormality, generalized weakness and confusion.  Patient at this time is slow to respond but alert and oriented.  No focal weakness but does seem to have some difficulty walking.  Mentions his legs feel like Jell-O.  His phenytoin level was found to be 32 with normal albumin and he is being admitted for phenytoin toxicity for overnight monitoring.  No recent change in doses or diet.  No liver problems or renal issues.  No prior levels available.  PAST MEDICAL HISTORY:   Past Medical History:  Diagnosis Date  . COPD (chronic obstructive pulmonary disease) ([place])   . Seizures ([place])     PAST SURGICAL HISTORY:   Past Surgical History:  Procedure Laterality Date  . COLONOSCOPY WITH PROPOFOL N/A 09/09/2017   Procedure: COLONOSCOPY WITH PROPOFOL;  Surgeon: Lucilla Lame, MD;  Location: [facility] ENDOSCOPY;  Service: Endoscopy;  Laterality: N/A;  . ESOPHAGOGASTRODUODENOSCOPY (EGD) WITH PROPOFOL N/A 09/09/2017   Procedure: ESOPHAGOGASTRODUODENOSCOPY (EGD) WITH PROPOFOL;  Surgeon: Lucilla Lame, MD;  Location: [facility] ENDOSCOPY;  Service: Endoscopy;  Laterality: N/A;  . LUMBAR DISC SURGERY      SOCIAL HISTORY:   Social History   Tobacco Use  . Smoking status: Current Every Day Smoker    Packs/day: 0.50    Types: Cigarettes  . Smokeless tobacco: Never Used  Substance Use Topics  . Alcohol use: No    Alcohol/week: 0.0 standard drinks    FAMILY HISTORY:   Family History  Problem Relation Age of Onset   . Cancer Mother        colon    DRUG ALLERGIES:   Allergies  Allergen Reactions  . Codeine   . Hydromorphone     REVIEW OF SYSTEMS:   Review of Systems  Constitutional: Positive for malaise/fatigue. Negative for chills, fever and weight loss.  HENT: Negative for hearing loss and nosebleeds.   Eyes: Negative for blurred vision, double vision and pain.  Respiratory: Negative for cough, hemoptysis, sputum production, shortness of breath and wheezing.   Cardiovascular: Negative for chest pain, palpitations, orthopnea and leg swelling.  Gastrointestinal: Negative for abdominal pain, constipation, diarrhea, nausea and vomiting.  Genitourinary: Negative for dysuria and hematuria.  Musculoskeletal: Negative for back pain, falls and myalgias.  Skin: Negative for rash.  Neurological: Negative for dizziness, tremors, sensory change, speech change, focal weakness, seizures and headaches.  Endo/Heme/Allergies: Does not bruise/bleed easily.  Psychiatric/Behavioral: Negative for depression and memory loss. The patient is not nervous/anxious.     MEDICATIONS AT HOME:   Prior to Admission medications   Medication Sig Start Date End Date Taking? Authorizing Provider  calcium carbonate (CALCIUM 600) 1500 (600 Ca) MG TABS tablet Take 1,500 mg by mouth daily.   Yes [provider]  Cholecalciferol (VITAMIN D-3) 125 MCG (5000 UT) TABS Take 5,000 Units by mouth daily.   Yes [provider]  Ferrous Sulfate (IRON SUPPLEMENT PO) Take 1 tablet by mouth once a week.  Yes [provider]  folic acid (FOLVITE) 235 MCG tablet Take 800 mcg by mouth daily.    Yes [provider]  phenytoin (DILANTIN) 100 MG ER capsule Take 2 capsules (200 mg total) by mouth 2 (two) times daily. 04/04/18  Yes Lada, Satira Anis, MD  vitamin B-12 (CYANOCOBALAMIN) 1000 MCG tablet Take 1,000 mcg by mouth daily.   Yes [provider]     VITAL SIGNS:  Blood pressure 130/84, pulse  64, temperature 99.1 F (37.3 C), temperature source Oral, resp. rate 17, SpO2 98 %.  PHYSICAL EXAMINATION:  Physical Exam  GENERAL:  63 y.o.-year-old patient lying in the bed with no acute distress.  EYES: Pupils equal, round, reactive to light and accommodation. No scleral icterus. Extraocular muscles intact.  HEENT: Head atraumatic, normocephalic. Oropharynx and nasopharynx clear. No oropharyngeal erythema, moist oral mucosa  NECK:  Supple, no jugular venous distention. No thyroid enlargement, no tenderness.  LUNGS: Normal breath sounds bilaterally, no wheezing, rales, rhonchi. No use of accessory muscles of respiration.  CARDIOVASCULAR: S1, S2 normal. No murmurs, rubs, or gallops.  ABDOMEN: Soft, nontender, nondistended. Bowel sounds present. No organomegaly or mass.  EXTREMITIES: No pedal edema, cyanosis, or clubbing. + 2 pedal & radial pulses b/l.   NEUROLOGIC: Cranial nerves II through XII are intact.  No sensory deficits.  Motor strength 5-/5 in lower extremities. PSYCHIATRIC: The patient is alert and oriented x 3. Good affect.  SKIN: No obvious rash, lesion, or ulcer.   LABORATORY PANEL:   CBC Recent Labs  Lab 08/13/18 1226  WBC 6.2  HGB 13.0  HCT 37.2*  PLT 282   ------------------------------------------------------------------------------------------------------------------  Chemistries  Recent Labs  Lab 08/13/18 1226  NA 138  K 3.9  CL 105  CO2 24  GLUCOSE 104*  BUN 19  CREATININE 0.78  CALCIUM 9.1  AST 19  ALT 19  ALKPHOS 99  BILITOT 0.6   ------------------------------------------------------------------------------------------------------------------  Cardiac Enzymes Recent Labs  Lab 08/13/18 1226  TROPONINI <0.03   ------------------------------------------------------------------------------------------------------------------  RADIOLOGY:  Dg Chest Port 1 View  Result Date: 08/13/2018 CLINICAL DATA:  Weakness. EXAM: PORTABLE CHEST 1 VIEW  COMPARISON:  None. FINDINGS: The heart size and mediastinal contours are within normal limits. Atherosclerotic calcification of the aortic arch. Normal pulmonary vascularity. No focal consolidation, pleural effusion, or pneumothorax. No acute osseous abnormality. Old left-sided rib fractures. IMPRESSION: No active disease. Electronically Signed   By: Titus Dubin M.D.   On: 08/13/2018 13:02     IMPRESSION AND PLAN:   *Phenytoin toxicity with gait abnormalities and confusion.  Patient will be admitted to medical floor with telemetry monitoring.  Start IV fluids.  Hold phenytoin.  Monitor for complications.  He takes 200 mg twice daily at home.  Would reduce this to 150 mg twice daily at discharge.  Follow-up with his [facility] clinic neurologist as outpatient after discharge. Observation admission  *COPD.  No wheezing.  Continue inhalers as needed.  DVT prophylaxis with Lovenox  All the records are reviewed and case discussed with ED provider. Management plans discussed with the patient, family and they are in agreement.  CODE STATUS: Full code  TOTAL TIME TAKING CARE OF THIS PATIENT: 40 minutes.   Leia Alf Jamil Armwood M.D on 08/13/2018 at 2:00 PM  Between 7am to 6pm - Pager - [phone]  After 6pm go to [email or web address] - password [email or web address] [facility]  Office  [phone]  CC: Primary care physician; Arnetha Courser, MD  Note: This dictation was  prepared with Dragon dictation along with smaller phrase technology. Any transcriptional errors that result from this process are unintentional.

## 2018-08-13 NOTE — ED Provider Notes (Signed)
[facility] Emergency Department Provider Note  ____________________________________________   I have reviewed the triage vital signs and the nursing notes. Where available I have reviewed prior notes and, if possible and indicated, outside hospital notes.    HISTORY  Chief Complaint Dizziness; Eye Drainage; and Weakness    HPI Nathan Mack is a 63 y.o. male  With a history of COPD, seizure disorder on Dilantin and has been on for years, gastritis, on colon neoplasm in the past, tobacco abuse, presents today because he feels weak all over since this morning when he woke up.  He denies any other symptoms.  He does have some drainage occasionally from his left eye but that is been going on for "months".  No change in that.  No fever no chills no nausea no vomiting diarrhea no abdominal pain no chest pain or shortness of breath no leg swelling no dysuria no change in bowel or bladder habits, no focal numbness or weakness no headache did not fall did not hit his head As he just feels generally weak.  No recent change in his Dilantin dosing.  No seizure.   History reviewed. No pertinent past medical history.  Patient Active Problem List   Diagnosis Date Noted  . COPD (chronic obstructive pulmonary disease) ([place]) 02/06/2018  . Benign neoplasm of descending colon   . Benign neoplasm of transverse colon   . Benign neoplasm of ascending colon   . Gastritis without bleeding   . Other diseases of stomach and duodenum   . Need for vaccination 08/06/2017  . Family history of colon cancer in mother 08/06/2017  . Wellness examination 11/14/2015  . Tobacco abuse counseling 02/16/2015  . Encounter for annual health examination 11/10/2014  . Seizure disorder, grand mal ([place]) 11/10/2014  . Snoring 11/10/2014    Past Surgical History:  Procedure Laterality Date  . COLONOSCOPY WITH PROPOFOL N/A 09/09/2017   Procedure: COLONOSCOPY WITH PROPOFOL;  Surgeon: Lucilla Lame, MD;   [facility] ENDOSCOPY;  Service: Endoscopy;  Laterality: N/A;  . ESOPHAGOGASTRODUODENOSCOPY (EGD) WITH PROPOFOL N/A 09/09/2017   Procedure: ESOPHAGOGASTRODUODENOSCOPY (EGD) WITH PROPOFOL;  Surgeon: Lucilla Lame, MD;  Location: [facility] ENDOSCOPY;  Service: Endoscopy;  Laterality: N/A;  . LUMBAR DISC SURGERY      Prior to Admission medications   Medication Sig Start Date End Date Taking? Authorizing Provider  CALCIUM CARBONATE-VIT D-MIN PO Take by mouth daily.     [provider]  Ferrous Sulfate (IRON SUPPLEMENT PO) Take 1 tablet by mouth once a week.     [provider]  folic acid (FOLVITE) 627 MCG tablet Take 800 mcg by mouth daily.     [provider]  phenytoin (DILANTIN) 100 MG ER capsule Take 2 capsules (200 mg total) by mouth 2 (two) times daily. 04/04/18   Arnetha Courser, MD  vitamin B-12 (CYANOCOBALAMIN) 1000 MCG tablet Take 1,000 mcg by mouth daily.    [provider]    Allergies Codeine and Hydromorphone  Family History  Problem Relation Age of Onset  . Cancer Mother        colon    Social History Social History   Tobacco Use  . Smoking status: Current Every Day Smoker    Packs/day: 0.50    Types: Cigarettes  . Smokeless tobacco: Never Used  Substance Use Topics  . Alcohol use: No    Alcohol/week: 0.0 standard drinks  . Drug use: No    Review of Systems Constitutional: No fever/chills Eyes: No  visual changes. ENT: No sore throat. No stiff neck no neck pain Cardiovascular: Denies chest pain. Respiratory: Denies shortness of breath. Gastrointestinal:   no vomiting.  No diarrhea.  No constipation. Genitourinary: Negative for dysuria. Musculoskeletal: Negative lower extremity swelling Skin: Negative for rash. Neurological: Negative for severe headaches, focal weakness or numbness.   ____________________________________________   PHYSICAL EXAM:  VITAL SIGNS: ED Triage Vitals  Enc Vitals Group     BP 08/13/18 1207  (!) 166/84     Pulse Rate 08/13/18 1207 70     Resp --      Temp 08/13/18 1207 99.1 F (37.3 C)     Temp Source 08/13/18 1207 Oral     SpO2 08/13/18 1207 100 %     Weight --      Height --      Head Circumference --      Peak Flow --      Pain Score 08/13/18 1152 0     Pain Loc --      Pain Edu? --      Excl. in [place]? --     Constitutional: Alert and oriented. Well appearing and in no acute distress. Eyes: Conjunctivae are normal Head: Atraumatic HEENT: No congestion/rhinnorhea. Mucous membranes are moist.  Oropharynx non-erythematous Neck:   Nontender with no meningismus, no masses, no stridor Cardiovascular: Normal rate, regular rhythm. Grossly normal heart sounds.  Good peripheral circulation. Respiratory: Normal respiratory effort.  No retractions. Lungs CTAB. Abdominal: Soft and nontender. No distention. No guarding no rebound Back:  There is no focal tenderness or step off.  there is no midline tenderness there are no lesions noted. there is no CVA tenderness Musculoskeletal: No lower extremity tenderness, no upper extremity tenderness. No joint effusions, no DVT signs strong distal pulses no edema Neurologic:  Normal speech and language. No gross focal neurologic deficits are appreciated.  Patient does have some diffuse weakness perhaps with no focality noted Skin:  Skin is warm, dry and intact. No rash noted. Psychiatric: Mood and affect are normal. Speech and behavior are normal.  ____________________________________________   LABS (all labs ordered are listed, but only abnormal results are displayed)  Labs Reviewed  URINE CULTURE  LACTIC ACID, PLASMA  LACTIC ACID, PLASMA  CBC WITH DIFFERENTIAL/PLATELET  COMPREHENSIVE METABOLIC PANEL  ETHANOL  TROPONIN I  URINALYSIS, COMPLETE (UACMP) WITH MICROSCOPIC  PHENYTOIN LEVEL, TOTAL  CBG MONITORING, ED    Pertinent labs  results that were available during my care of the patient were reviewed by me and considered in my  medical decision making (see chart for details). ____________________________________________  EKG  I personally interpreted any EKGs ordered by me or triage 3 EKGs were performed because patient initially was somewhat tremulous, the first 2 we will discard the second 1 the last 1 shows sinus rhythm rate 69 no acute ST elevation or depression, borderline LAD no acute ischemia, the first to have too much artifact to be read ____________________________________________  RADIOLOGY  Pertinent labs & imaging results that were available during my care of the patient were reviewed by me and considered in my medical decision making (see chart for details). If possible, patient and/or family made aware of any abnormal findings.  No results found. ____________________________________________    PROCEDURES  Procedure(s) performed: None  Procedures  Critical Care performed: None  ____________________________________________   INITIAL IMPRESSION / ASSESSMENT AND PLAN / ED COURSE  Pertinent labs & imaging results that were available during my care of the patient were  reviewed by me and considered in my medical decision making (see chart for details).  Patient here with feelings of generalized weakness with a nonfocal neurologic exam, he states his eye is draining for months, this does not appear to be connected to his chief complaint at this time.  He does not seem to have any focality to his exam to the extent that he is compliant with it.  Abdomen is benign, exam is reassuring.  He has a low-grade temperature we will check urinalysis chest x-ray cardiac enzymes and reassess.    ____________________________________________   FINAL CLINICAL IMPRESSION(S) / ED DIAGNOSES  Final diagnoses:  Weakness      This chart was dictated using voice recognition software.  Despite best efforts to proofread,  errors can occur which can change meaning.      Schuyler Amor, MD 08/13/18 [id number]

## 2018-08-13 NOTE — ED Triage Notes (Signed)
Pt reports dizziness, rubbery legs, weakness and eye draining since about 4:30 this am.

## 2018-08-13 NOTE — ED Notes (Signed)
Pt gave verbal consent to give information to his wife Husam Hohn over the phone

## 2018-08-13 NOTE — Telephone Encounter (Signed)
Wife called in but put pt on the phone.  He was at work this morning at 4:30 AM when his head felt "cloudy" then dizzy and my legs were wobbly like I was going to fall.  His co-workers sit him in a chair for a few minutes.   They told him he was confused a little.    He drove himself home from work. While triaging him he asked his wife in the background the answers to my questions.  His speech was clear but he was unable to answer my questions himself except for a few. He did mention he had epilepsy.  He mentioned he spoke with a nurse last night on the phone but did not tell me why he call her.  He out of the blue told me he had 2 sores in his nose and that he is blowing his nose constantly.  He also has a sore in his eye that is draining.   I let him know he needed to go to the ED now.   His wife is going to take him to [facility] now.  See triage notes.  I sent these notes to Dr. Delight Ovens office so she would be aware.   Reason for Disposition . [1] Loss of speech or garbled speech AND [2] sudden onset AND [3] present now    Per wife speech is slow.   Pt unable to answer most of my questions without asking his wife to help him answer.   Still feeling "cloudy" in his head and has had a headache for 3 days.  Doesn't have headache now.  Answer Assessment - Initial Assessment Questions 1. SYMPTOM: "What is the main symptom you are concerned about?" (e.g., weakness, numbness)     I was at work.   My head was cloudy, then dizzy and my legs were wobbly like I was going to fall.   I didn't have any pain in my heart.   I didn't tell the nurse last night.  But I have 2 sores in my nose and one in my eye.   I blowing my nose constantly.    2. ONSET: "When did this start?" (minutes, hours, days; while sleeping)     4:30 AM this morning I was weak and my legs wobbly.   I still feel cloudy like in my head. 3. LAST NORMAL: Constant.    I have epilepsy.    5. CARDIAC SYMPTOMS: "Have  you had any of the following symptoms: chest pain, difficulty breathing, palpitations?"     No 6. NEUROLOGIC SYMPTOMS: "Have you had any of the following symptoms: headache, dizziness, vision loss, double vision, changes in speech, unsteady on your feet?"     Epilepsy.   I've had a headache for 3 days that ended last night.   Unsteady on my feet earlier.   My speech was fine but slow per my wife. 7. OTHER SYMPTOMS: "Do you have any other symptoms?"     I got real cold this morning.  I still feel cold now.   I don't know why. He is having to ask his wife to help answer questions. I had a spell like this when I had pneumonia.    12 years ago. 8. PREGNANCY: "Is there any chance you are pregnant?" "When was your last menstrual period?"     N/A  Protocols used: NEUROLOGIC DEFICIT-A-AH

## 2018-08-13 NOTE — ED Notes (Signed)
ED TO INPATIENT HANDOFF REPORT  ED Nurse Name and Phone #: Janett Billow [phone]  S Name/Age/Gender Nathan Mack 63 y.o. male Room/Bed: ED26A/ED26A  Code Status   Code Status: Full Code  Home/SNF/Other Home Patient oriented to: self, place, time and situation Is this baseline? Yes   Triage Complete: Triage complete  Chief Complaint leg pain  Triage Note Pt reports dizziness, rubbery legs, weakness and eye draining since about 4:30 this am.    Allergies Allergies  Allergen Reactions  . Codeine   . Hydromorphone     Level of Care/Admitting Diagnosis ED Disposition    ED Disposition Condition [facility] Area: [place] [[id number]]  Level of Care: Med-Surg [16]  Covid Evaluation: N/A  Diagnosis: Dilantin toxicity [841660]  Admitting Physician: Hillary Bow [[id number]]  Attending Physician: Hillary Bow [[id number]]  PT Class (Do Not Modify): Observation [104]  PT Acc Code (Do Not Modify): Observation [10022]       B Medical/Surgery History Past Medical History:  Diagnosis Date  . COPD (chronic obstructive pulmonary disease) ([place])   . Seizures ([place])    Past Surgical History:  Procedure Laterality Date  . COLONOSCOPY WITH PROPOFOL N/A 09/09/2017   Procedure: COLONOSCOPY WITH PROPOFOL;  Surgeon: Lucilla Lame, MD;  Location[facility];  Service: Endoscopy;  Laterality: N/A;  . ESOPHAGOGASTRODUODENOSCOPY (EGD) WITH PROPOFOL N/A 09/09/2017   Procedure: ESOPHAGOGASTRODUODENOSCOPY (EGD) WITH PROPOFOL;  Surgeon: Lucilla Lame, MD;  Location: [facility] ENDOSCOPY;  Service: Endoscopy;  Laterality: N/A;  . LUMBAR [place] SURGERY       A IV Location/Drains/Wounds Patient Lines/Drains/Airways Status   Active Line/Drains/Airways    Name:   Placement date:   Placement time:   Site:   Days:   Peripheral IV 08/13/18 Left Antecubital   08/13/18    1230    Antecubital   less than 1          Intake/Output Last 24 hours No intake or output data in the  24 hours ending 08/13/18 1416  Labs/Imaging Results for orders placed or performed during the hospital encounter of 08/13/18 (from the past 48 hour(s))  Lactic acid, plasma     Status: None   Collection Time: 08/13/18 12:26 PM  Result Value Ref Range   Lactic Acid, Venous 1.0 0.5 - 1.9 mmol/L    Comment: Performed at [facility], [address] [place], [place] [address]  CBC with Differential     Status: Abnormal   Collection Time: 08/13/18 12:26 PM  Result Value Ref Range   WBC 6.2 4.0 - 10.5 K/uL   RBC 3.84 (L) 4.22 - 5.81 MIL/uL   Hemoglobin 13.0 13.0 - 17.0 g/dL   HCT 37.2 (L) 39.0 - 52.0 %   MCV 96.9 80.0 - 100.0 fL   MCH 33.9 26.0 - 34.0 pg   MCHC 34.9 30.0 - 36.0 g/dL   RDW 12.8 11.5 - 15.5 %   Platelets 282 150 - 400 K/uL   nRBC 0.0 0.0 - 0.2 %   Neutrophils Relative % 52 %   Neutro Abs 3.2 1.7 - 7.7 K/uL   Lymphocytes Relative 39 %   Lymphs Abs 2.4 0.7 - 4.0 K/uL   Monocytes Relative 5 %   Monocytes Absolute 0.3 0.1 - 1.0 K/uL   Eosinophils Relative 3 %   Eosinophils Absolute 0.2 0.0 - 0.5 K/uL   Basophils Relative 1 %   Basophils Absolute 0.1 0.0 - 0.1 K/uL   Immature Granulocytes 0 %  Abs Immature Granulocytes 0.01 0.00 - 0.07 K/uL    Comment: Performed at [facility], [address] [place], [place] [address]  Comprehensive metabolic panel     Status: Abnormal   Collection Time: 08/13/18 12:26 PM  Result Value Ref Range   Sodium 138 135 - 145 mmol/L   Potassium 3.9 3.5 - 5.1 mmol/L   Chloride 105 98 - 111 mmol/L   CO2 24 22 - 32 mmol/L   Glucose, Bld 104 (H) 70 - 99 mg/dL   BUN 19 8 - 23 mg/dL   Creatinine, Ser 0.78 0.61 - 1.24 mg/dL   Calcium 9.1 8.9 - 10.3 mg/dL   Total Protein 7.8 6.5 - 8.1 g/dL   Albumin 4.3 3.5 - 5.0 g/dL   AST 19 15 - 41 U/L   ALT 19 0 - 44 U/L   Alkaline Phosphatase 99 38 - 126 U/L   Total Bilirubin 0.6 0.3 - 1.2 mg/dL   GFR calc non Af Amer >60 >60 mL/min   GFR calc Af Amer >60 >60 mL/min   Anion gap  9 5 - 15    Comment: Performed at [facility], [address], [place], [place] [address]  Ethanol     Status: None   Collection Time: 08/13/18 12:26 PM  Result Value Ref Range   Alcohol, Ethyl (B) <10 <10 mg/dL    Comment: (NOTE) Lowest detectable limit for serum alcohol is 10 mg/dL. For medical purposes only. Performed at [facility], [address] [place], [place] [address]   Troponin I - Once     Status: None   Collection Time: 08/13/18 12:26 PM  Result Value Ref Range   Troponin I <0.03 <0.03 ng/mL    Comment: Performed at [facility], [address] [place], [place] [address]  Urinalysis, Complete w Microscopic     Status: Abnormal   Collection Time: 08/13/18 12:26 PM  Result Value Ref Range   Color, Urine STRAW (A) YELLOW   APPearance CLEAR (A) CLEAR   Specific Gravity, Urine 1.006 1.005 - 1.030   pH 6.0 5.0 - 8.0   Glucose, UA NEGATIVE NEGATIVE mg/dL   Hgb urine dipstick SMALL (A) NEGATIVE   Bilirubin Urine NEGATIVE NEGATIVE   Ketones, ur NEGATIVE NEGATIVE mg/dL   Protein, ur NEGATIVE NEGATIVE mg/dL   Nitrite NEGATIVE NEGATIVE   Leukocytes,Ua NEGATIVE NEGATIVE   RBC / HPF 0-5 0 - 5 RBC/hpf   WBC, UA 0-5 0 - 5 WBC/hpf   Bacteria, UA NONE SEEN NONE SEEN   Squamous Epithelial / LPF NONE SEEN 0 - 5    Comment: Performed at [facility], [address] [place], [place] [address]  Phenytoin level, total     Status: Abnormal   Collection Time: 08/13/18 12:26 PM  Result Value Ref Range   Phenytoin Lvl 31.7 (HH) 10.0 - 20.0 ug/mL    Comment: CRITICAL RESULT CALLED TO, READ BACK BY AND VERIFIED WITH JESSICA FULCHER 08/13/18 @ 1316  [place] Performed at [facility], [address] [place],  [address]   Glucose, capillary     Status: None   Collection Time: 08/13/18 12:36 PM  Result Value Ref Range   Glucose-Capillary 88 70 - 99 mg/dL   Dg Chest Port 1 View  Result Date: 08/13/2018 CLINICAL DATA:  Weakness.  EXAM: PORTABLE CHEST 1 VIEW COMPARISON:  None. FINDINGS: The heart size and mediastinal contours are within normal limits. Atherosclerotic calcification of the aortic arch.  Normal pulmonary vascularity. No focal consolidation, pleural effusion, or pneumothorax. No acute osseous abnormality. Old left-sided rib fractures. IMPRESSION: No active disease. Electronically Signed   By: Titus Dubin M.D.   On: 08/13/2018 13:02    Pending Labs Unresulted Labs (From admission, onward)    Start     Ordered   08/20/18 0500  Creatinine, serum  (enoxaparin (LOVENOX)    CrCl >/= 30 ml/min)  Weekly,   STAT    Comments:  while on enoxaparin therapy    08/13/18 1356   08/14/18 0500  Comprehensive metabolic panel  Tomorrow morning,   STAT     08/13/18 1356   08/14/18 0500  CBC  Tomorrow morning,   STAT     08/13/18 1356   08/14/18 0500  Phenytoin level, total  Tomorrow morning,   STAT     08/13/18 1357   08/13/18 1357  HIV antibody (Routine Testing)  Add-on,   AD     08/13/18 1356   08/13/18 1221  Urine culture  Once,   STAT     08/13/18 1220   08/13/18 1220  Lactic acid, plasma  Now then every 2 hours,   STAT     08/13/18 1220          Vitals/Pain Today's Vitals   08/13/18 1300 08/13/18 1315 08/13/18 1330 08/13/18 1345  BP: 140/84  130/84   Pulse: 61 64 65 64  Resp: 14 14 20 17   Temp:      TempSrc:      SpO2: 98% 99% 98% 98%  PainSc:        Isolation Precautions No active isolations  Medications Medications  enoxaparin (LOVENOX) injection 40 mg (has no administration in time range)  sodium chloride flush (NS) 0.9 % injection 3 mL (has no administration in time range)  acetaminophen (TYLENOL) tablet 650 mg (has no administration in time range)    Or  acetaminophen (TYLENOL) suppository 650 mg (has no administration in time range)  polyethylene glycol (MIRALAX / GLYCOLAX) packet 17 g (has no administration in time range)  ondansetron (ZOFRAN) tablet 4 mg (has no administration in time  range)    Or  ondansetron (ZOFRAN) injection 4 mg (has no administration in time range)  lactated ringers infusion (has no administration in time range)  sodium chloride 0.9 % bolus 500 mL (500 mLs Intravenous New Bag/Given 08/13/18 1238)    Mobility walks Low fall risk   Focused Assessments Neuro Assessment Handoff:  Swallow screen pass? NA     Last date known well: 08/13/18 Last time known well: 0430 Neuro Assessment: Exceptions to WDL Neuro Checks:      Last Documented NIHSS Modified Score:   Has TPA been given? No If patient is a Neuro Trauma and patient is going to OR before floor call report to [place] nurse: [phone] or [phone]     R Recommendations: See Admitting Provider Note  Report given to:   Additional Notes:

## 2018-08-13 NOTE — Progress Notes (Signed)
Advance care planning  Purpose of Encounter Dilantin toxicity, CODE STATUS discussion  Parties in Attendance Patient  Patients Decisional capacity Alert and oriented.  Able to make medical decisions.  Patient tells me he would like his wife to be healthcare power of attorney.  No documentation place.  Encouraged to have this done.  No ACP documents in place.  Discussed regarding the Dilantin toxicity, symptoms, treatment plan, prognosis.  All questions answered.  Discussed regarding CODE STATUS and patient tells me he would like to be DO NOT RESUSCITATE DO NOT INTUBATE.  Explained the difference between full code and DNR.  Patient agrees.  Orders entered and CODE STATUS changed.  DNR/DNI  Time spent - 17 minutes

## 2018-08-13 NOTE — ED Notes (Signed)
Left eye red and watery, pt lying in bed, A&O x 4 but states "I feel like I am not laying here"

## 2018-08-14 ENCOUNTER — Telehealth: Payer: Self-pay | Admitting: Family Medicine

## 2018-08-14 LAB — COMPREHENSIVE METABOLIC PANEL
ALT: 16 U/L (ref 0–44)
AST: 19 U/L (ref 15–41)
Albumin: 3.4 g/dL — ABNORMAL LOW (ref 3.5–5.0)
Alkaline Phosphatase: 84 U/L (ref 38–126)
Anion gap: 6 (ref 5–15)
BUN: 13 mg/dL (ref 8–23)
CO2: 24 mmol/L (ref 22–32)
Calcium: 8.5 mg/dL — ABNORMAL LOW (ref 8.9–10.3)
Chloride: 111 mmol/L (ref 98–111)
Creatinine, Ser: 0.72 mg/dL (ref 0.61–1.24)
GFR calc Af Amer: >60 mL/min (ref >60)
GFR calc non Af Amer: >60 mL/min (ref >60)
Glucose, Bld: 104 mg/dL — ABNORMAL HIGH (ref 70–99)
Potassium: 4 mmol/L (ref 3.5–5.1)
Sodium: 141 mmol/L (ref 135–145)
Total Bilirubin: 0.6 mg/dL (ref 0.3–1.2)
Total Protein: 6.5 g/dL (ref 6.5–8.1)

## 2018-08-14 LAB — CBC
HCT: 36.2 % — ABNORMAL LOW (ref 39.0–52.0)
Hemoglobin: 12.5 g/dL — ABNORMAL LOW (ref 13.0–17.0)
MCH: 33.5 pg (ref 26.0–34.0)
MCHC: 34.5 g/dL (ref 30.0–36.0)
MCV: 97.1 fL (ref 80.0–100.0)
Platelets: 263 10*3/uL (ref 150–400)
RBC: 3.73 MIL/uL — ABNORMAL LOW (ref 4.22–5.81)
RDW: 12.8 % (ref 11.5–15.5)
WBC: 5.5 10*3/uL (ref 4.0–10.5)
nRBC: 0 % (ref 0.0–0.2)

## 2018-08-14 LAB — URINE CULTURE: Culture: NO GROWTH

## 2018-08-14 LAB — PHENYTOIN LEVEL, TOTAL: Phenytoin Lvl: 22.4 ug/mL — ABNORMAL HIGH (ref 10.0–20.0)

## 2018-08-14 MED ORDER — PHENYTOIN SODIUM EXTENDED 100 MG PO CAPS: 300.0000 mg | ORAL_CAPSULE | Freq: Every day | ORAL | 1 refills | Status: DC

## 2018-08-14 NOTE — Discharge Summary (Signed)
[facility] [employer] - [name] at [facility], 63 y.o., DOB 03-Jul-1955, MRN [id number]. Admission date: 08/13/2018 Discharge Date 08/14/2018 Primary MD Arnetha Courser, MD Admitting Physician Hillary Bow, MD  Admission Diagnosis  Weakness [R53.1] Phenytoin poisoning of undetermined intent, initial encounter [T42.0X4A]  Discharge Diagnosis   Active Problems: Dilantin toxicity COPD without exacerbation History of seizure disorder       Hospital Course  Nathan Mack  is a 63 y.o. male with a known history of seizures, COPD presented to the hospital due to gait abnormality, generalized weakness and confusion.  Patient at this time is slow to respond but alert and oriented.  No focal weakness but does seem to have some difficulty walking.  Mentions his legs feel like Jell-O.  His phenytoin level was found to be 32 with normal albumin and he is being admitted for phenytoin toxicity for overnight monitoring.  Patient's Dilantin was held.  Patient doing much better Dilantin level is less he is very anxious to go home.  I have asked him not to take Dilantin tonight.  He can resume tomorrow at a lesser dose.            Consults  None  Significant Tests:  See full reports for all details     Dg Chest Port 1 View  Result Date: 08/13/2018 CLINICAL DATA:  Weakness. EXAM: PORTABLE CHEST 1 VIEW COMPARISON:  None. FINDINGS: The heart size and mediastinal contours are within normal limits. Atherosclerotic calcification of the aortic arch. Normal pulmonary vascularity. No focal consolidation, pleural effusion, or pneumothorax. No acute osseous abnormality. Old left-sided rib fractures. IMPRESSION: No active disease. Electronically Signed   By: Titus Dubin M.D.   On: 08/13/2018 13:02       Today   Subjective:   Nathan Mack patient doing much better no symptoms Objective:   Blood pressure (!) 132/96, pulse 64, temperature 98.3 F (36.8 C), temperature source Oral,  resp. rate 18, height 5\' 7"  (1.702 m), SpO2 100 %.  .  Intake/Output Summary (Last 24 hours) at 08/14/2018 1044 Last data filed at 08/14/2018 0900 Gross per 24 hour  Intake 1741.21 ml  Output -  Net 1741.21 ml    Exam VITAL SIGNS: Blood pressure (!) 132/96, pulse 64, temperature 98.3 F (36.8 C), temperature source Oral, resp. rate 18, height 5\' 7"  (1.702 m), SpO2 100 %.  GENERAL:  63 y.o.-year-old patient lying in the bed with no acute distress.  EYES: Pupils equal, round, reactive to light and accommodation. No scleral icterus. Extraocular muscles intact.  HEENT: Head atraumatic, normocephalic. Oropharynx and nasopharynx clear.  NECK:  Supple, no jugular venous distention. No thyroid enlargement, no tenderness.  LUNGS: Normal breath sounds bilaterally, no wheezing, rales,rhonchi or crepitation. No use of accessory muscles of respiration.  CARDIOVASCULAR: S1, S2 normal. No murmurs, rubs, or gallops.  ABDOMEN: Soft, nontender, nondistended. Bowel sounds present. No organomegaly or mass.  EXTREMITIES: No pedal edema, cyanosis, or clubbing.  NEUROLOGIC: Cranial nerves II through XII are intact. Muscle strength 5/5 in all extremities. Sensation intact. Gait not checked.  PSYCHIATRIC: The patient is alert and oriented x 3.  SKIN: No obvious rash, lesion, or ulcer.   Data Review     CBC w Diff:  Lab Results  Component Value Date   WBC 5.5 08/14/2018   HGB 12.5 (L) 08/14/2018   HGB 13.8 11/15/2014   HCT 36.2 (L) 08/14/2018   HCT 39.8 11/15/2014   PLT 263 08/14/2018   PLT  287 11/15/2014   LYMPHOPCT 39 08/13/2018   MONOPCT 5 08/13/2018   EOSPCT 3 08/13/2018   BASOPCT 1 08/13/2018   CMP:  Lab Results  Component Value Date   NA 141 08/14/2018   NA 139 08/10/2015   K 4.0 08/14/2018   CL 111 08/14/2018   CO2 24 08/14/2018   BUN 13 08/14/2018   BUN 19 08/10/2015   CREATININE 0.72 08/14/2018   CREATININE 0.86 03/06/2017   PROT 6.5 08/14/2018   PROT 7.0 08/10/2015   ALBUMIN  3.4 (L) 08/14/2018   ALBUMIN 4.2 08/10/2015   BILITOT 0.6 08/14/2018   BILITOT 0.3 08/10/2015   ALKPHOS 84 08/14/2018   AST 19 08/14/2018   ALT 16 08/14/2018  .  Micro Results No results found for this or any previous visit (from the past 240 hour(s)).      Code Status Orders  (From admission, onward)         Start     Ordered   08/13/18 1418  Do not attempt resuscitation (DNR)  Continuous    Question Answer Comment  In the event of cardiac or respiratory ARREST Do not call a "code blue"   In the event of cardiac or respiratory ARREST Do not perform Intubation, CPR, defibrillation or ACLS   In the event of cardiac or respiratory ARREST Use medication by any route, position, wound care, and other measures to relive pain and suffering. May use oxygen, suction and manual treatment of airway obstruction as needed for comfort.      08/13/18 1417        Code Status History    Date Active Date Inactive Code Status Order ID Comments User Context   08/13/2018 1356 08/13/2018 1417 Full Code [id number]  Hillary Bow, MD ED          Follow-up Information    Arnetha Courser, MD On 08/18/2018.   Specialty:  Family Medicine Why:  @10am    TELEPHONE VISIT   CHECK DILATIN AT TIME OF VISIT  Contact information: [address] [place] [address] [phone]           Discharge Medications   Allergies as of 08/14/2018      Reactions   Codeine    Hydromorphone       Medication List    TAKE these medications   Calcium 600 1500 (600 Ca) MG Tabs tablet Generic drug:  calcium carbonate Take 1,500 mg by mouth daily.   folic acid 532 MCG tablet Commonly known as:  FOLVITE Take 800 mcg by mouth daily.   IRON SUPPLEMENT PO Take 1 tablet by mouth once a week.   phenytoin 100 MG ER capsule Commonly known as:  DILANTIN Take 3 capsules (300 mg total) by mouth at bedtime. What changed:    how much to take  when to take this   vitamin B-12 1000 MCG  tablet Commonly known as:  CYANOCOBALAMIN Take 1,000 mcg by mouth daily.   Vitamin D-3 125 MCG (5000 UT) Tabs Take 5,000 Units by mouth daily.          Total Time in preparing paper work, data evaluation and todays exam - 70 minutes  Dustin Flock M.D on 08/14/2018 at 10:44 AM [place]  [phone]

## 2018-08-14 NOTE — Telephone Encounter (Signed)
Copied from [place] [phone]. Topic: General - Inquiry >> Aug 14, 2018 11:55 AM Scherrie Gerlach wrote: Reason for CRM: pt's wife calling to ask if pt can get a work note to stay out of work. Pt went to the ED, spent  1 night, came home today, and is supposed to work tomorrow.  But the hospital did not write anything on his discharged papers, but thinks he is supposed to be out of work for one week.   Would like Dr Sanda Klein to write him out of work starting now.   Pt has hosp fup on Tues, 08/18/18  Pt home and wold like a call back.

## 2018-08-14 NOTE — Telephone Encounter (Signed)
Out of work through Aug 21, 2018 He may return to work on Aug 22, 2018 Medical reason Thank you Please write

## 2018-08-14 NOTE — Progress Notes (Signed)
Pt discharged per MD order. IV removed. Discharged instructions reviewed with pt. Pt verbalized understanding and all questions answered to pt satisfaction. Pt taken to car in wheelchair by staff.

## 2018-08-14 NOTE — Telephone Encounter (Signed)
Letter faxed per patients request [phone]

## 2018-08-15 LAB — HIV ANTIBODY (ROUTINE TESTING W REFLEX): HIV Screen 4th Generation wRfx: NONREACTIVE

## 2018-08-18 ENCOUNTER — Ambulatory Visit (INDEPENDENT_AMBULATORY_CARE_PROVIDER_SITE_OTHER): Payer: Managed Care, Other (non HMO) | Admitting: Nurse Practitioner

## 2018-08-18 ENCOUNTER — Inpatient Hospital Stay: Payer: Managed Care, Other (non HMO) | Admitting: Family Medicine

## 2018-08-18 ENCOUNTER — Other Ambulatory Visit: Payer: Self-pay

## 2018-08-18 ENCOUNTER — Encounter: Payer: Self-pay | Admitting: Nurse Practitioner

## 2018-08-18 VITALS — BP 129/80 | HR 69 | Temp 97.4°F | Ht 67.0 in | Wt 140.0 lb

## 2018-08-18 DIAGNOSIS — Z122 Encounter for screening for malignant neoplasm of respiratory organs: Secondary | ICD-10-CM | POA: Diagnosis not present

## 2018-08-18 DIAGNOSIS — R2681 Unsteadiness on feet: Secondary | ICD-10-CM

## 2018-08-18 DIAGNOSIS — R634 Abnormal weight loss: Secondary | ICD-10-CM | POA: Diagnosis not present

## 2018-08-18 DIAGNOSIS — T420X1A Poisoning by hydantoin derivatives, accidental (unintentional), initial encounter: Secondary | ICD-10-CM

## 2018-08-18 DIAGNOSIS — F17210 Nicotine dependence, cigarettes, uncomplicated: Secondary | ICD-10-CM | POA: Diagnosis not present

## 2018-08-18 NOTE — Progress Notes (Signed)
Virtual Visit via Video Note  I connected with Nathan Mack on 08/18/18 at  8:20 AM EDT by a video enabled telemedicine application and verified that I am speaking with the correct person using two identifiers.   Staff discussed the limitations of evaluation and management by telemedicine and the availability of in person appointments. The patient expressed understanding and agreed to proceed.  Patient location: home  My location: Work office Other people present: Hilda Wexler  HPI  Patient was seen at [facility] on 4/23 and admitted for phenytoin poisoning. He was a history of seizures and presented to the ER with  gait abnormality, generalized weakness and confusion. phenytoin level was found to be 31.7 with normal albumin no liver or renal issues noted. Upon discharge 22.4 phenytoin level. Patients dilantin was held till Saturday then started on 300mg  ER daily, he was taking 400mg  of dilantin ER daily. Last seizure was over 30 years ago.   States overall is feeling better states gait is improved, weakness has mildly improved but states is still having difficulty.  Just moved to a new house this past month and he has been very busy and has not been eating as much and more active and has noticed he has been losing weight. He endorses lack of appetite, states he cannot taste as much right now. Wife believes he has a sinus infection. Patient denies nasal congestion currently but states has a sore in his right nostril. Patient denies maxillary or frontal sinus tenderness.   Wt Readings from Last 3 Encounters:  08/18/18 140 lb (63.5 kg)  02/06/18 150 lb (68 kg)  08/28/17 153 lb (69.4 kg)     PHQ2/9: Depression screen [facility] 2/9 08/18/2018 02/06/2018 08/06/2017 06/06/2017 03/06/2017  Decreased Interest 0 0 0 0 0  Down, Depressed, Hopeless 0 0 0 0 0  PHQ - 2 Score 0 0 0 0 0  Altered sleeping 0 0 - - -  Tired, decreased energy 0 0 - - -  Change in appetite 0 0 - - -  Feeling bad or failure about yourself  0  0 - - -  Trouble concentrating 0 0 - - -  Moving slowly or fidgety/restless 0 0 - - -  Suicidal thoughts 0 0 - - -  PHQ-9 Score 0 0 - - -  Difficult doing work/chores Not difficult at all Not difficult at all - - -     PHQ reviewed. Negative  Patient Active Problem List   Diagnosis Date Noted  . Dilantin toxicity 08/13/2018  . COPD (chronic obstructive pulmonary disease) ([place]) 02/06/2018  . Benign neoplasm of descending colon   . Benign neoplasm of transverse colon   . Benign neoplasm of ascending colon   . Gastritis without bleeding   . Other diseases of stomach and duodenum   . Need for vaccination 08/06/2017  . Family history of colon cancer in mother 08/06/2017  . Wellness examination 11/14/2015  . Tobacco abuse counseling 02/16/2015  . Encounter for annual health examination 11/10/2014  . Seizure disorder, grand mal ([place]) 11/10/2014  . Snoring 11/10/2014    Past Medical History:  Diagnosis Date  . COPD (chronic obstructive pulmonary disease) ([place])   . Seizures ([place])     Past Surgical History:  Procedure Laterality Date  . COLONOSCOPY WITH PROPOFOL N/A 09/09/2017   Procedure: COLONOSCOPY WITH PROPOFOL;  Surgeon: Lucilla Lame, MD;  [facility]: [facility];  Service: Endoscopy;  Laterality: N/A;  . ESOPHAGOGASTRODUODENOSCOPY (EGD) WITH PROPOFOL N/A 09/09/2017  Procedure: ESOPHAGOGASTRODUODENOSCOPY (EGD) WITH PROPOFOL;  Surgeon: Lucilla Lame, MD;  Location: [facility] ENDOSCOPY;  Service: Endoscopy;  Laterality: N/A;  . LUMBAR DISC SURGERY      Social History   Tobacco Use  . Smoking status: Current Every Day Smoker    Packs/day: 0.50    Types: Cigarettes  . Smokeless tobacco: Never Used  Substance Use Topics  . Alcohol use: No    Alcohol/week: 0.0 standard drinks     Current Outpatient Medications:  .  calcium carbonate (CALCIUM 600) 1500 (600 Ca) MG TABS tablet, Take 1,500 mg by mouth daily., Disp: , Rfl:  .  Cholecalciferol (VITAMIN D-3) 125 MCG (5000 UT)  TABS, Take 5,000 Units by mouth daily., Disp: , Rfl:  .  Ferrous Sulfate (IRON SUPPLEMENT PO), Take 1 tablet by mouth once a week. , Disp: , Rfl:  .  folic acid (FOLVITE) 621 MCG tablet, Take 800 mcg by mouth daily. , Disp: , Rfl:  .  phenytoin (DILANTIN) 100 MG ER capsule, Take 3 capsules (300 mg total) by mouth at bedtime., Disp: 360 capsule, Rfl: 1 .  vitamin B-12 (CYANOCOBALAMIN) 1000 MCG tablet, Take 1,000 mcg by mouth daily., Disp: , Rfl:   Allergies  Allergen Reactions  . Codeine   . Hydromorphone     ROS    No other specific complaints in a complete review of systems (except as listed in HPI above).  Objective  Vitals:   08/18/18 0817  BP: 129/80  Pulse: 69  Temp: (!) 97.4 F (36.3 C)  TempSrc: Temporal  Weight: 140 lb (63.5 kg)  Height: 5\' 7"  (1.702 m)     Body mass index is 21.93 kg/m.  Nursing Note and Vital Signs reviewed.  Physical Exam  Constitutional: Patient appears well-developed and well-nourished. No distress.  HENT: Head: Normocephalic and atraumatic. Cardiovascular: Normal rate Pulmonary/Chest: Effort normal  Musculoskeletal: Normal range of motion,  Neurological: he is alert and oriented to person, place, and time. speech and gait are normal.  Skin: No rash noted. No erythema.  Psychiatric: Patient has a normal mood and affect. behavior is normal. Judgment and thought content normal.    Assessment & Plan  1. Accidental phenytoin overdose, initial encounter Improving slowly symptomatically, recheck today.  - Dilantin (Phenytoin) level, total  2. Unintentional weight loss Wife states because he has been working more and eating less, will do basic labs, encouraged nutritional supplementations and follow-up in one month  - TSH - Basic Metabolic Panel (BMET)  3. Encounter for screening for malignant neoplasm of respiratory organs - CT CHEST LUNG CA SCREEN LOW DOSE W/O CM; Future  4. Smoking greater than 30 pack years - CT CHEST LUNG CA  SCREEN LOW DOSE W/O CM; Future  5. Unsteady gait Discussed in home incremental increases in walking and exercise, work note provided releasing him bac kto work on May 1st- will follow-up then and see how he is doing.  - Ambulatory referral to Physical Therapy     Follow Up Instructions:  Follow up on Friday    I discussed the assessment and treatment plan with the patient. The patient was provided an opportunity to ask questions and all were answered. The patient agreed with the plan and demonstrated an understanding of the instructions.   The patient was advised to call back or seek an in-person evaluation if the symptoms worsen or if the condition fails to improve as anticipated.  I provided 30 minutes of non-face-to-face time during this encounter.  Fredderick Severance, NP

## 2018-08-19 LAB — BASIC METABOLIC PANEL
BUN: 22 mg/dL (ref 7–25)
CO2: 30 mmol/L (ref 20–32)
Calcium: 9.7 mg/dL (ref 8.6–10.3)
Chloride: 104 mmol/L (ref 98–110)
Creat: 0.72 mg/dL (ref 0.70–1.25)
Glucose, Bld: 83 mg/dL (ref 65–99)
Potassium: 4.1 mmol/L (ref 3.5–5.3)
Sodium: 138 mmol/L (ref 135–146)

## 2018-08-19 LAB — PHENYTOIN LEVEL, TOTAL: Phenytoin, Total: 11.5 mg/L (ref 10.0–20.0)

## 2018-08-19 LAB — TSH: TSH: 1.41 mIU/L (ref 0.40–4.50)

## 2018-08-20 ENCOUNTER — Telehealth: Payer: Self-pay | Admitting: *Deleted

## 2018-08-20 NOTE — Telephone Encounter (Signed)
Contacted patient regarding lung screening scan referral. Patient notified that he will be scheduled for scan as soon as restrictions allow.

## 2018-08-21 ENCOUNTER — Other Ambulatory Visit: Payer: Self-pay | Admitting: Nurse Practitioner

## 2018-08-21 ENCOUNTER — Other Ambulatory Visit: Payer: Self-pay

## 2018-08-21 ENCOUNTER — Ambulatory Visit (INDEPENDENT_AMBULATORY_CARE_PROVIDER_SITE_OTHER): Payer: Managed Care, Other (non HMO) | Admitting: Nurse Practitioner

## 2018-08-21 ENCOUNTER — Telehealth: Payer: Self-pay | Admitting: Family Medicine

## 2018-08-21 VITALS — BP 124/83 | HR 69 | Temp 96.8°F | Wt 140.0 lb

## 2018-08-21 DIAGNOSIS — T420X1A Poisoning by hydantoin derivatives, accidental (unintentional), initial encounter: Secondary | ICD-10-CM | POA: Diagnosis not present

## 2018-08-21 DIAGNOSIS — Z860100 Personal history of colon polyps, unspecified: Secondary | ICD-10-CM

## 2018-08-21 DIAGNOSIS — R29898 Other symptoms and signs involving the musculoskeletal system: Secondary | ICD-10-CM

## 2018-08-21 DIAGNOSIS — R630 Anorexia: Secondary | ICD-10-CM

## 2018-08-21 DIAGNOSIS — J302 Other seasonal allergic rhinitis: Secondary | ICD-10-CM

## 2018-08-21 DIAGNOSIS — R2681 Unsteadiness on feet: Secondary | ICD-10-CM | POA: Diagnosis not present

## 2018-08-21 DIAGNOSIS — R634 Abnormal weight loss: Secondary | ICD-10-CM

## 2018-08-21 DIAGNOSIS — D509 Iron deficiency anemia, unspecified: Secondary | ICD-10-CM

## 2018-08-21 DIAGNOSIS — Z720 Tobacco use: Secondary | ICD-10-CM

## 2018-08-21 DIAGNOSIS — G40409 Other generalized epilepsy and epileptic syndromes, not intractable, without status epilepticus: Secondary | ICD-10-CM

## 2018-08-21 DIAGNOSIS — R350 Frequency of micturition: Secondary | ICD-10-CM

## 2018-08-21 MED ORDER — MONTELUKAST SODIUM 10 MG PO TABS: 10.0000 mg | ORAL_TABLET | Freq: Every day | ORAL | 0 refills | Status: DC

## 2018-08-21 MED ORDER — LORATADINE 10 MG PO TABS: 10.0000 mg | ORAL_TABLET | Freq: Every day | ORAL | 2 refills | Status: DC

## 2018-08-21 MED ORDER — TAMSULOSIN HCL 0.4 MG PO CAPS: 0.4000 mg | ORAL_CAPSULE | Freq: Every day | ORAL | 0 refills | Status: DC

## 2018-08-21 NOTE — Telephone Encounter (Signed)
He can come in to provide urine sample if still having urinary symptoms. If he does please provide him with 3 take home hemoccult cards and let him know I put in a consult with hematology and GI because I am concerned about his decreased appetite, weight loss and history of anemia. He should receive phone calls within the next 2 weeks as I have reached out with both specialists.

## 2018-08-21 NOTE — Telephone Encounter (Signed)
I called the patient to schedule a 1 week follow up visit with elizabeth. They then wanted to know if they are suppose to come in on Monday to do lab work. Please advise because I am not sure. Also, they are wanting his note to be faxed to his job: [employer] Fax: [phone] I will fax it over for you

## 2018-08-21 NOTE — Progress Notes (Signed)
Virtual Visit via Video Note  I connected with Nathan Mack on 08/21/18 at  9:00 AM EDT by a video enabled telemedicine application and verified that I am speaking with the correct person using two identifiers.   Staff discussed the limitations of evaluation and management by telemedicine and the availability of in person appointments. The patient expressed understanding and agreed to proceed.  Patient location: home  My location: home office Other people present:  wife HPI Patient dorsals multiple complaints he would like to address today.   He notes he has been sneezing a lot and has watery eyes.  This has been going on since February, states worse in left eye.  This is worse when he goes outside.  Denies fevers or chills, red eye, purulent discharge.  Tried OTC sinus med intermittently with mild relief.  Endorses bilateral lower extremities weakness in the morning and feels they get stronger during they day. States his knees and ankles are stiff in the morning and when he walks around gets easier to move around.  Feels this has worsened after his hospitalization.  Denies paresthesias, falls, lower back pain, bowel or bladder incontinence.  Patient states he has urinary frequency, denies straining, but peeing small amounts. States has noticed this since Thursday. Denies foul smell, hematuria or dysuria.  Endorses mild intermittent left lower groin pain.  Patient endorses decreased appetite and significant weight loss over a month.  Previously discussed at last visit that his wife believed it was because they were so busy moving that he forgot to eat and was working out much more than normal with move.  He states decreased appetite has significantly worsened after his hospitalization, states the past few days he has barely had a desire to eat anything.  Wife has been supplementing with protein shakes in the morning.  Denies nausea, vomiting, abdominal pain, myalgias.  Wt Readings from Last 3  Encounters:  08/21/18 140 lb (63.5 kg)  08/18/18 140 lb (63.5 kg)  02/06/18 150 lb (68 kg)     PHQ2/9: Depression screen [facility] 2/9 08/21/2018 08/18/2018 02/06/2018 08/06/2017 06/06/2017  Decreased Interest 0 0 0 0 0  Down, Depressed, Hopeless 0 0 0 0 0  PHQ - 2 Score 0 0 0 0 0  Altered sleeping 0 0 0 - -  Tired, decreased energy 0 0 0 - -  Change in appetite 0 0 0 - -  Feeling bad or failure about yourself  0 0 0 - -  Trouble concentrating 0 0 0 - -  Moving slowly or fidgety/restless 0 0 0 - -  Suicidal thoughts 0 0 0 - -  PHQ-9 Score 0 0 0 - -  Difficult doing work/chores - Not difficult at all Not difficult at all - -    PHQ reviewed. Negative  Patient Active Problem List   Diagnosis Date Noted  . Dilantin toxicity 08/13/2018  . COPD (chronic obstructive pulmonary disease) ([place]) 02/06/2018  . Benign neoplasm of descending colon   . Benign neoplasm of transverse colon   . Benign neoplasm of ascending colon   . Gastritis without bleeding   . Other diseases of stomach and duodenum   . Need for vaccination 08/06/2017  . Family history of colon cancer in mother 08/06/2017  . Wellness examination 11/14/2015  . Tobacco abuse counseling 02/16/2015  . Encounter for annual health examination 11/10/2014  . Seizure disorder, grand mal ([place]) 11/10/2014  . Snoring 11/10/2014    Past Medical History:  Diagnosis Date  .  COPD (chronic obstructive pulmonary disease) ([place])   . Seizures ([place])     Past Surgical History:  Procedure Laterality Date  . COLONOSCOPY WITH PROPOFOL N/A 09/09/2017   Procedure: COLONOSCOPY WITH PROPOFOL;  Surgeon: Lucilla Lame, MD;  Location: [facility] ENDOSCOPY;  Service: Endoscopy;  Laterality: N/A;  . ESOPHAGOGASTRODUODENOSCOPY (EGD) WITH PROPOFOL N/A 09/09/2017   Procedure: ESOPHAGOGASTRODUODENOSCOPY (EGD) WITH PROPOFOL;  Surgeon: Lucilla Lame, MD;  Location: [facility] ENDOSCOPY;  Service: Endoscopy;  Laterality: N/A;  . LUMBAR DISC SURGERY      Social History    Tobacco Use  . Smoking status: Current Every Day Smoker    Packs/day: 0.50    Types: Cigarettes  . Smokeless tobacco: Never Used  Substance Use Topics  . Alcohol use: No    Alcohol/week: 0.0 standard drinks     Current Outpatient Medications:  .  calcium carbonate (CALCIUM 600) 1500 (600 Ca) MG TABS tablet, Take 1,500 mg by mouth daily., Disp: , Rfl:  .  Cholecalciferol (VITAMIN D-3) 125 MCG (5000 UT) TABS, Take 5,000 Units by mouth daily., Disp: , Rfl:  .  Ferrous Sulfate (IRON SUPPLEMENT PO), Take 1 tablet by mouth once a week. , Disp: , Rfl:  .  folic acid (FOLVITE) 235 MCG tablet, Take 800 mcg by mouth daily. , Disp: , Rfl:  .  loratadine (CLARITIN) 10 MG tablet, Take 1 tablet (10 mg total) by mouth daily., Disp: 30 tablet, Rfl: 2 .  montelukast (SINGULAIR) 10 MG tablet, Take 1 tablet (10 mg total) by mouth at bedtime., Disp: 30 tablet, Rfl: 0 .  phenytoin (DILANTIN) 100 MG ER capsule, Take 3 capsules (300 mg total) by mouth at bedtime., Disp: 360 capsule, Rfl: 1 .  tamsulosin (FLOMAX) 0.4 MG CAPS capsule, Take 1 capsule (0.4 mg total) by mouth daily., Disp: 7 capsule, Rfl: 0 .  vitamin B-12 (CYANOCOBALAMIN) 1000 MCG tablet, Take 1,000 mcg by mouth daily., Disp: , Rfl:   Allergies  Allergen Reactions  . Codeine   . Hydromorphone     ROS   No other specific complaints in a complete review of systems (except as listed in HPI above).  Objective  Vitals:   08/21/18 0819 08/21/18 0923  BP:  124/83  Pulse:  69  Temp:  (!) 96.8 F (36 C)  Weight: 140 lb (63.5 kg)     Body mass index is 21.93 kg/m.  Nursing Note and Vital Signs reviewed.  Physical Exam  Constitutional: Patient appears well-developed and well-nourished. No distress.  HENT: Head: Normocephalic and atraumatic. Cardiovascular: Normal rate Pulmonary/Chest: Effort normal  Musculoskeletal: Normal range of motion,  Neurological: he is alert and oriented to person, place, and time. speech and gait are  normal.  Skin: No rash noted. No erythema.  Psychiatric: Patient has a normal mood and affect. behavior is normal. Judgment and thought content normal.    Assessment & Plan  1. Seizure disorder, grand mal ([place]) Recommend neurology evaluation for seizures due to overdose and weakness and unsteady gait. - Ambulatory referral to Neurology  2. Accidental phenytoin overdose, initial encounter - Ambulatory referral to Neurology  3. Seasonal allergies Will trial antihistamine alone first if not getting relief with this can temporarily add on singulair discussed warning  - loratadine (CLARITIN) 10 MG tablet; Take 1 tablet (10 mg total) by mouth daily.  Dispense: 30 tablet; Refill: 2 - montelukast (SINGULAIR) 10 MG tablet; Take 1 tablet (10 mg total) by mouth at bedtime.  Dispense: 30 tablet; Refill: 0  4. Unsteady  gait - Ambulatory referral to Neurology - Ambulatory referral to Physical Therapy  5. Weakness of both lower extremities Also endorses stiffness possible osteoarthritis  - Ambulatory referral to Neurology - Ambulatory referral to Physical Therapy  6. Urinary frequency - Urinalysis, Routine w reflex microscopic - tamsulosin (FLOMAX) 0.4 MG CAPS capsule; Take 1 capsule (0.4 mg total) by mouth daily.  Dispense: 7 capsule; Refill: 0  7. Loss of appetite Message sent to oncologist and GI to discuss further work up, continue with protein shake supplementation   7. Loss of appetite - POC Hemoccult Bld/Stl (3-Cd Home Screen); Future  8. History of colon polyps - POC Hemoccult Bld/Stl (3-Cd Home Screen); Future   Follow Up Instructions:    I discussed the assessment and treatment plan with the patient. The patient was provided an opportunity to ask questions and all were answered. The patient agreed with the plan and demonstrated an understanding of the instructions.   The patient was advised to call back or seek an in-person evaluation if the symptoms worsen or if the  condition fails to improve as anticipated.  I provided 30 minutes of non-face-to-face time during this encounter.   Fredderick Severance, NP

## 2018-08-25 ENCOUNTER — Inpatient Hospital Stay: Payer: Managed Care, Other (non HMO) | Attending: Oncology | Admitting: Oncology

## 2018-08-25 ENCOUNTER — Encounter: Payer: Self-pay | Admitting: Gastroenterology

## 2018-08-25 ENCOUNTER — Inpatient Hospital Stay: Payer: Managed Care, Other (non HMO)

## 2018-08-25 ENCOUNTER — Other Ambulatory Visit: Payer: Self-pay

## 2018-08-25 ENCOUNTER — Ambulatory Visit (INDEPENDENT_AMBULATORY_CARE_PROVIDER_SITE_OTHER): Payer: Managed Care, Other (non HMO) | Admitting: Gastroenterology

## 2018-08-25 ENCOUNTER — Encounter: Payer: Self-pay | Admitting: Oncology

## 2018-08-25 VITALS — BP 115/69 | HR 69 | Temp 95.5°F | Ht 67.0 in | Wt 149.3 lb

## 2018-08-25 DIAGNOSIS — D649 Anemia, unspecified: Secondary | ICD-10-CM | POA: Insufficient documentation

## 2018-08-25 DIAGNOSIS — D500 Iron deficiency anemia secondary to blood loss (chronic): Secondary | ICD-10-CM | POA: Diagnosis not present

## 2018-08-25 DIAGNOSIS — R634 Abnormal weight loss: Secondary | ICD-10-CM

## 2018-08-25 DIAGNOSIS — F17210 Nicotine dependence, cigarettes, uncomplicated: Secondary | ICD-10-CM

## 2018-08-25 DIAGNOSIS — G4089 Other seizures: Secondary | ICD-10-CM | POA: Diagnosis not present

## 2018-08-25 DIAGNOSIS — Z716 Tobacco abuse counseling: Secondary | ICD-10-CM

## 2018-08-25 DIAGNOSIS — J449 Chronic obstructive pulmonary disease, unspecified: Secondary | ICD-10-CM

## 2018-08-25 DIAGNOSIS — R5383 Other fatigue: Secondary | ICD-10-CM | POA: Diagnosis not present

## 2018-08-25 LAB — CBC WITH DIFFERENTIAL/PLATELET
Abs Immature Granulocytes: 0.03 10*3/uL (ref 0.00–0.07)
Basophils Absolute: 0.1 10*3/uL (ref 0.0–0.1)
Basophils Relative: 1 %
Eosinophils Absolute: 0.2 10*3/uL (ref 0.0–0.5)
Eosinophils Relative: 3 %
HCT: 38.9 % — ABNORMAL LOW (ref 39.0–52.0)
Hemoglobin: 13.6 g/dL (ref 13.0–17.0)
Immature Granulocytes: 0 %
Lymphocytes Relative: 39 %
Lymphs Abs: 3 10*3/uL (ref 0.7–4.0)
MCH: 34 pg (ref 26.0–34.0)
MCHC: 35 g/dL (ref 30.0–36.0)
MCV: 97.3 fL (ref 80.0–100.0)
Monocytes Absolute: 0.5 10*3/uL (ref 0.1–1.0)
Monocytes Relative: 6 %
Neutro Abs: 3.8 10*3/uL (ref 1.7–7.7)
Neutrophils Relative %: 51 %
Platelets: 301 10*3/uL (ref 150–400)
RBC: 4 MIL/uL — ABNORMAL LOW (ref 4.22–5.81)
RDW: 12.7 % (ref 11.5–15.5)
WBC: 7.6 10*3/uL (ref 4.0–10.5)
nRBC: 0 % (ref 0.0–0.2)

## 2018-08-25 LAB — RETIC PANEL
Immature Retic Fract: 2.8 % (ref 2.3–15.9)
RBC.: 4 MIL/uL — ABNORMAL LOW (ref 4.22–5.81)
Retic Count, Absolute: 40.8 10*3/uL (ref 19.0–186.0)
Retic Ct Pct: 1 % (ref 0.4–3.1)
Reticulocyte Hemoglobin: 37.9 pg (ref >27.9)

## 2018-08-25 LAB — URINALYSIS, ROUTINE W REFLEX MICROSCOPIC
Bilirubin Urine: NEGATIVE
Glucose, UA: NEGATIVE
Hgb urine dipstick: NEGATIVE
Ketones, ur: NEGATIVE
Leukocytes,Ua: NEGATIVE
Nitrite: NEGATIVE
Protein, ur: NEGATIVE
Specific Gravity, Urine: 1.014 (ref 1.001–1.03)
pH: 6.5 (ref 5.0–8.0)

## 2018-08-25 LAB — COMPREHENSIVE METABOLIC PANEL
ALT: 30 U/L (ref 0–44)
AST: 23 U/L (ref 15–41)
Albumin: 4 g/dL (ref 3.5–5.0)
Alkaline Phosphatase: 115 U/L (ref 38–126)
Anion gap: 7 (ref 5–15)
BUN: 20 mg/dL (ref 8–23)
CO2: 28 mmol/L (ref 22–32)
Calcium: 9.2 mg/dL (ref 8.9–10.3)
Chloride: 104 mmol/L (ref 98–111)
Creatinine, Ser: 0.79 mg/dL (ref 0.61–1.24)
GFR calc Af Amer: >60 mL/min (ref >60)
GFR calc non Af Amer: >60 mL/min (ref >60)
Glucose, Bld: 96 mg/dL (ref 70–99)
Potassium: 4.9 mmol/L (ref 3.5–5.1)
Sodium: 139 mmol/L (ref 135–145)
Total Bilirubin: 0.4 mg/dL (ref 0.3–1.2)
Total Protein: 7.2 g/dL (ref 6.5–8.1)

## 2018-08-25 LAB — LACTATE DEHYDROGENASE: LDH: 109 U/L (ref 98–192)

## 2018-08-25 NOTE — Progress Notes (Signed)
Lucilla Lame, MD [address]  [place]  [place], [place] [address]  Main: [phone]  Fax: [phone]    Gastroenterology Virtual/Video Visit  Referring Provider:     Arnetha Courser, MD Primary Care Physician:  Arnetha Courser, MD Primary Gastroenterologist:  Dr.Jorita Bohanon [place] Reason for Consultation:     Weight loss with a history of polyps and iron deficiency anemia        HPI:    Virtual Visit via Video Note Location of the patient: At the cancer center in the parking lot with his wife Location of provider: Office  Participating persons: The patient myself and Nathan Mack.  I connected with Nathan Mack on 08/25/18 at 12:30 PM EDT by a video enabled telemedicine application and verified that I am speaking with the correct person using two identifiers.   I discussed the limitations of evaluation and management by telemedicine and the availability of in person appointments. The patient expressed understanding and agreed to proceed.  Verbal consent to proceed obtained.  History of Present Illness: Nathan Mack is a 63 y.o. male referred by Dr. Sanda Klein, Satira Anis, MD with a history of having a colonoscopy last year with multiple polyps removed and one being a 12 mm with adenoma with the recommendation of a repeat colonoscopy in 1 year.  The patient was seen by his primary care provider and found to have a decreased appetite with weight loss and was referred back for evaluation to me.  I spoke to the wife who states that the patient is not eating as much and has been very active with moving his house to a new location and his wife states that he has been very busy and overworking and that may have contributed to his weight loss.  Past Medical History:  Diagnosis Date  . COPD (chronic obstructive pulmonary disease) ([place])   . Seizures ([place])     Past Surgical History:  Procedure Laterality Date  . COLONOSCOPY WITH PROPOFOL N/A 09/09/2017   Procedure: COLONOSCOPY WITH  PROPOFOL;  Surgeon: Lucilla Lame, MD;  Location[facility] ENDOSCOPY;  Service: Endoscopy;  Laterality: N/A;  . ESOPHAGOGASTRODUODENOSCOPY (EGD) WITH PROPOFOL N/A 09/09/2017   Procedure: ESOPHAGOGASTRODUODENOSCOPY (EGD) WITH PROPOFOL;  Surgeon: Lucilla Lame, MD;  Location: [facility] ENDOSCOPY;  Service: Endoscopy;  Laterality: N/A;  . LUMBAR [place]    . TONSILLECTOMY      Prior to Admission medications   Medication Sig Start Date End Date Taking? Authorizing Provider  calcium carbonate (CALCIUM 600) 1500 (600 Ca) MG TABS tablet Take 1,500 mg by mouth daily.    [provider]  Cholecalciferol (VITAMIN D-3) 125 MCG (5000 UT) TABS Take 5,000 Units by mouth daily.    [provider]  Ferrous Sulfate (IRON SUPPLEMENT PO) Take 1 tablet by mouth once a week.     [provider]  folic acid (FOLVITE) 195 MCG tablet Take 800 mcg by mouth daily.     [provider]  loratadine (CLARITIN) 10 MG tablet Take 1 tablet (10 mg total) by mouth daily. 08/21/18   Poulose, Bethel Born, NP  montelukast (SINGULAIR) 10 MG tablet Take 1 tablet (10 mg total) by mouth at bedtime. 08/21/18   Poulose, Bethel Born, NP  phenytoin (DILANTIN) 100 MG ER capsule Take 3 capsules (300 mg total) by mouth at bedtime. 08/14/18   Dustin Flock, MD  tamsulosin (FLOMAX) 0.4 MG CAPS capsule Take 1 capsule (0.4 mg total) by mouth daily. 08/21/18   Suezanne Cheshire  E, NP  vitamin B-12 (CYANOCOBALAMIN) 1000 MCG tablet Take 1,000 mcg by mouth daily.    [provider]    Family History  Problem Relation Age of Onset  . Colon cancer Mother      Social History   Tobacco Use  . Smoking status: Current Every Day Smoker    Packs/day: 0.50    Years: 40.00    Pack years: 20.00    Types: Cigarettes  . Smokeless tobacco: Never Used  Substance Use Topics  . Alcohol use: No    Alcohol/week: 0.0 standard drinks  . Drug use: No    Allergies as of 08/25/2018 - Review Complete 08/25/2018  Allergen  Reaction Noted  . Codeine  11/10/2014  . Hydromorphone  11/10/2014    Review of Systems:    All systems reviewed and negative except where noted in HPI.   Observations/Objective:  Labs: CBC    Component Value Date/Time   WBC 5.5 08/14/2018 0515   RBC 3.73 (L) 08/14/2018 0515   HGB 12.5 (L) 08/14/2018 0515   HGB 13.8 11/15/2014 1047   HCT 36.2 (L) 08/14/2018 0515   HCT 39.8 11/15/2014 1047   PLT 263 08/14/2018 0515   PLT 287 11/15/2014 1047   MCV 97.1 08/14/2018 0515   MCV 96 11/15/2014 1047   MCH 33.5 08/14/2018 0515   MCHC 34.5 08/14/2018 0515   RDW 12.8 08/14/2018 0515   RDW 14.4 11/15/2014 1047   LYMPHSABS 2.4 08/13/2018 1226   LYMPHSABS 2.7 11/15/2014 1047   MONOABS 0.3 08/13/2018 1226   EOSABS 0.2 08/13/2018 1226   EOSABS 0.3 11/15/2014 1047   BASOSABS 0.1 08/13/2018 1226   BASOSABS 0.1 11/15/2014 1047   CMP     Component Value Date/Time   NA 138 08/18/2018 1408   NA 139 08/10/2015 0941   K 4.1 08/18/2018 1408   CL 104 08/18/2018 1408   CO2 30 08/18/2018 1408   GLUCOSE 83 08/18/2018 1408   BUN 22 08/18/2018 1408   BUN 19 08/10/2015 0941   CREATININE 0.72 08/18/2018 1408   CALCIUM 9.7 08/18/2018 1408   PROT 6.5 08/14/2018 0515   PROT 7.0 08/10/2015 0941   ALBUMIN 3.4 (L) 08/14/2018 0515   ALBUMIN 4.2 08/10/2015 0941   AST 19 08/14/2018 0515   ALT 16 08/14/2018 0515   ALKPHOS 84 08/14/2018 0515   BILITOT 0.6 08/14/2018 0515   BILITOT 0.3 08/10/2015 0941   GFRNONAA >60 08/14/2018 0515   GFRNONAA 94 03/06/2017 1458   GFRAA >60 08/14/2018 0515   GFRAA 108 03/06/2017 1458    Imaging Studies: Dg Chest Port 1 View  Result Date: 08/13/2018 CLINICAL DATA:  Weakness. EXAM: PORTABLE CHEST 1 VIEW COMPARISON:  None. FINDINGS: The heart size and mediastinal contours are within normal limits. Atherosclerotic calcification of the aortic arch. Normal pulmonary vascularity. No focal consolidation, pleural effusion, or pneumothorax. No acute osseous abnormality.  Old left-sided rib fractures. IMPRESSION: No active disease. Electronically Signed   By: Titus Dubin M.D.   On: 08/13/2018 13:02    Assessment and Plan:   Nathan Mack is a 63 y.o. y/o male has been referred for weight loss with decreased appetite and a history of large colon polyps.  The patient will be set up for an EGD and colonoscopy.  The patient has a history of a recommendation to have a repeat colonoscopy this month from his previous colonoscopy.  The patient will be contacted to set up the EGD and colonoscopy.  Follow Up Instructions:  I discussed the assessment and treatment plan with the patient. The patient was provided an opportunity to ask questions and all were answered. The patient agreed with the plan and demonstrated an understanding of the instructions.   The patient was advised to call back or seek an in-person evaluation if the symptoms worsen or if the condition fails to improve as anticipated.  I provided 15 minutes of non-face-to-face time during this encounter.   Lucilla Lame, MD  Speech recognition software was used to dictate the above note.

## 2018-08-25 NOTE — Progress Notes (Signed)
Patient here today as a new patient  

## 2018-08-26 NOTE — Progress Notes (Signed)
Hematology/Oncology Consult note [facility] Telephone:([phone] [phone]   Patient Care Team: Arnetha Courser, MD as PCP - General (Family Medicine)  REFERRING PROVIDER: Arnetha Courser, MD, Suezanne Cheshire NP CHIEF COMPLAINTS/REASON FOR VISIT:  Initial consultation for evaluation of unintentional weight loss.  HISTORY OF PRESENTING ILLNESS:  Nathan Mack is a  63 y.o.  male with PMH listed below who was referred to me for initial consultation for evaluation of unintentional weight loss. Patient was recently seen by primary care provider via video visit.  He was noticed that patient has lost 10 pounds since his previous visit at PCPs office in October 2020.  Also associated with feeling weak. Reports increasing allergy symptoms. History of seizure disorder, on phenytoin.  And he has been referred to neurology for weakness and unsteady gait.  He recently had an admission from 08/13/2018 to 08/14/2018 due to unsteady gait, generalized weakness and confusion and was found to have Significantly elevated phenytoin.  Was admitted for phenytoin toxicity for overnight monitoring.  He was released to follow-up outpatient with primary care physician.  He was referred to neurology for further evaluation.  Patient reports that he moved his home in March 2020.  Reports that has been a very stressful period of time as he gets no help but he got over and get the move done by himself.  Reports that he feels that he had lost weight during that period of time.  Also had stressful work during that shift.  Does not eat appropriately.  He weight 150 pounds in October 2020 at primary care physician's office.  Currently he has weight documented in epic being 140.  Patient reports that this is the weight he measured at home after taking shower, without having close on. Today in the clinic, he weighs 149 pounds with clothes, jeans and shoes on. Reports appetite is slightly better.   Denies any fever, chills, chest pain, nausea, vomiting, dysuria, leg swelling, night sweating He believes that the weight loss is secondary to the move, and not being able to eat and has a busy working schedule.  Also has decreased appetite after his hospitalization for phenytoin toxicity. Also feel that the weakness is a combination of increased phenytoin level.  He is a current every day smoker, currently smoking half a pack a day.  In the past he had smoked more than 1 pack a day for about 30 to 40 years.   Colonoscopy on 09/09/2017 showed colon polyps which were removed.  Endoscopy showed normal esophagus.  Gastritis.  Erythematous new adenopathy   Review of Systems  Constitutional: Positive for fatigue. Negative for appetite change, chills, fever and unexpected weight change.  HENT:   Negative for hearing loss and voice change.   Eyes: Negative for eye problems and icterus.  Respiratory: Negative for chest tightness, cough and shortness of breath.   Cardiovascular: Negative for chest pain and leg swelling.  Gastrointestinal: Negative for abdominal distention and abdominal pain.  Endocrine: Negative for hot flashes.  Genitourinary: Negative for difficulty urinating, dysuria and frequency.   Musculoskeletal: Negative for arthralgias.  Skin: Negative for itching and rash.  Neurological: Negative for light-headedness and numbness.  Hematological: Negative for adenopathy. Does not bruise/bleed easily.  Psychiatric/Behavioral: Negative for confusion.    MEDICAL HISTORY:  Past Medical History:  Diagnosis Date  . COPD (chronic obstructive pulmonary disease) ([place])   . Seizures ([place])     SURGICAL HISTORY: Past Surgical History:  Procedure Laterality Date  .  COLONOSCOPY WITH PROPOFOL N/A 09/09/2017   Procedure: COLONOSCOPY WITH PROPOFOL;  Surgeon: Lucilla Lame, MD;  Location: [facility] ENDOSCOPY;  Service: Endoscopy;  Laterality: N/A;  . ESOPHAGOGASTRODUODENOSCOPY (EGD) WITH PROPOFOL N/A  09/09/2017   Procedure: ESOPHAGOGASTRODUODENOSCOPY (EGD) WITH PROPOFOL;  Surgeon: Lucilla Lame, MD;  Location: [facility] ENDOSCOPY;  Service: Endoscopy;  Laterality: N/A;  . LUMBAR [place]    . TONSILLECTOMY      SOCIAL HISTORY: Social History   Socioeconomic History  . Marital status: Married    Spouse name: Not on file  . Number of children: Not on file  . Years of education: Not on file  . Highest education level: Not on file  Occupational History  . Not on file  Social Needs  . Financial resource strain: Not hard at all  . Food insecurity:    Worry: Never true    Inability: Never true  . Transportation needs:    Medical: No    Non-medical: No  Tobacco Use  . Smoking status: Current Every Day Smoker    Packs/day: 0.50    Years: 40.00    Pack years: 20.00    Types: Cigarettes  . Smokeless tobacco: Never Used  Substance and Sexual Activity  . Alcohol use: No    Alcohol/week: 0.0 standard drinks  . Drug use: No  . Sexual activity: Yes  Lifestyle  . Physical activity:    Days per week: 5 days    Minutes per session: 30 min  . Stress: Not at all  Relationships  . Social connections:    Talks on phone: Three times a week    Gets together: Once a week    Attends religious service: Patient refused    Active member of club or organization: Patient refused    Attends meetings of clubs or organizations: Patient refused    Relationship status: Married  . Intimate partner violence:    Fear of current or ex partner: No    Emotionally abused: No    Physically abused: No    Forced sexual activity: No  Other Topics Concern  . Not on file  Social History Narrative  . Not on file    FAMILY HISTORY: Family History  Problem Relation Age of Onset  . Colon cancer Mother     ALLERGIES:  is allergic to codeine and hydromorphone.  MEDICATIONS:  Current Outpatient Medications  Medication Sig Dispense Refill  . calcium carbonate (CALCIUM 600) 1500 (600 Ca) MG TABS tablet  Take 1,500 mg by mouth daily.    . Cholecalciferol (VITAMIN D-3) 125 MCG (5000 UT) TABS Take 5,000 Units by mouth daily.    . Ferrous Sulfate (IRON SUPPLEMENT PO) Take 1 tablet by mouth once a week.     . folic acid (FOLVITE) 993 MCG tablet Take 800 mcg by mouth daily.     Marland Kitchen loratadine (CLARITIN) 10 MG tablet Take 1 tablet (10 mg total) by mouth daily. 30 tablet 2  . montelukast (SINGULAIR) 10 MG tablet Take 1 tablet (10 mg total) by mouth at bedtime. 30 tablet 0  . phenytoin (DILANTIN) 100 MG ER capsule Take 3 capsules (300 mg total) by mouth at bedtime. 360 capsule 1  . tamsulosin (FLOMAX) 0.4 MG CAPS capsule Take 1 capsule (0.4 mg total) by mouth daily. 7 capsule 0  . vitamin B-12 (CYANOCOBALAMIN) 1000 MCG tablet Take 1,000 mcg by mouth daily.     No current facility-administered medications for this visit.      PHYSICAL EXAMINATION: ECOG  PERFORMANCE STATUS: 1 - Symptomatic but completely ambulatory Vitals:   08/25/18 1128  BP: 115/69  Pulse: 69  Temp: (!) 95.5 F (35.3 C)   Filed Weights   08/25/18 1128  Weight: 149 lb 5 oz (67.7 kg)  He weighs 146 pounds after removing his shoes, and jacket, wearing one T-shirt and jeans.  Physical Exam Constitutional:      General: He is not in acute distress. HENT:     Head: Normocephalic and atraumatic.  Eyes:     General: No scleral icterus.    Pupils: Pupils are equal, round, and reactive to light.  Neck:     Musculoskeletal: Normal range of motion and neck supple.  Cardiovascular:     Rate and Rhythm: Normal rate and regular rhythm.     Heart sounds: Normal heart sounds.  Pulmonary:     Effort: Pulmonary effort is normal. No respiratory distress.     Breath sounds: No wheezing.  Abdominal:     General: Bowel sounds are normal. There is no distension.     Palpations: Abdomen is soft. There is no mass.     Tenderness: There is no abdominal tenderness.  Musculoskeletal: Normal range of motion.        General: No deformity.   Skin:    General: Skin is warm and dry.     Findings: No erythema or rash.  Neurological:     Mental Status: He is alert and oriented to person, place, and time.     Cranial Nerves: No cranial nerve deficit.     Coordination: Coordination normal.  Psychiatric:        Behavior: Behavior normal.        Thought Content: Thought content normal.      LABORATORY DATA:  I have reviewed the data as listed Lab Results  Component Value Date   WBC 7.6 08/25/2018   HGB 13.6 08/25/2018   HCT 38.9 (L) 08/25/2018   MCV 97.3 08/25/2018   PLT 301 08/25/2018   Recent Labs    08/13/18 1226 08/14/18 0515 08/18/18 1408 08/25/18 1210  NA 138 141 138 139  K 3.9 4.0 4.1 4.9  CL 105 111 104 104  CO2 24 24 30 28   GLUCOSE 104* 104* 83 96  BUN 19 13 22 20   CREATININE 0.78 0.72 0.72 0.79  CALCIUM 9.1 8.5* 9.7 9.2  GFRNONAA >60 >60  --  >60  GFRAA >60 >60  --  >60  PROT 7.8 6.5  --  7.2  ALBUMIN 4.3 3.4*  --  4.0  AST 19 19  --  23  ALT 19 16  --  30  ALKPHOS 99 84  --  115  BILITOT 0.6 0.6  --  0.4   Iron/TIBC/Ferritin/ %Sat    Component Value Date/Time   IRON 104 08/06/2017 1010   TIBC 294 08/06/2017 1010   FERRITIN 99 08/06/2017 1010   IRONPCTSAT 35 08/06/2017 1010    RADIOGRAPHIC STUDIES: I have personally reviewed the radiological images as listed and agreed with the findings in the report. 08/13/2018 Chest x-ray portable 1 view showed normal heart size, mediastinal contour within normal limits.  Atherosclerotic calcifications of the aortic arch.  Normal pulmonary vascularity.  No focal consolidation, pleural effusion or pneumothorax.  No acute osseous abnormality.  Old left-sided rib fractures.  ASSESSMENT & PLAN:  1. Weight loss   2. Tobacco abuse counseling   3. Normocytic anemia    Labs and imaging were independently reviewed and  discussed with patient. Weight loss, etiology unknown.  Discussed with patient that weight loss may be secondary to scale difference, a  difference between night weight and weight with clothes and shoes, versus unintentional weight loss secondary to underlying disease. TSH has been recently checked which was normal. Recommend checking CBC, CMP, reticulocyte panel, flow cytometry, LDH, Additional testing will be ordered based on initial lab work-up results. He has a mild anemia with hemoglobin of 12.5 which can be secondary to phenytoin toxicity versus other etiologies.  I will repeat CBC today.  Tobacco abuse counseling, smoke cessation was discussed in detail with patient.  He meets criteria to have CT lung cancer screening done.  Offered patient to be referred to lung cancer screening program.  Patient reports that he has already been contacted by lung cancer screening program and will proceed with the lung cancer CT low-dose in July 2020.  Recommend patient to continue monitor his weight at home.  If weight is stable or increasing, this is reassuring. If he continues to lose weight he will give me a call and update me.  Currently he does not have significant constitutional symptoms.  Physical examination unremarkable. Recommend follow-up in the clinic in 3 months for lab and MD assessment.  Orders Placed This Encounter  Procedures  . CBC with Differential/Platelet    Standing Status:   Future    Number of Occurrences:   1    Standing Expiration Date:   08/25/2019  . Comprehensive metabolic panel    Standing Status:   Future    Number of Occurrences:   1    Standing Expiration Date:   08/25/2019  . Retic Panel    Standing Status:   Future    Number of Occurrences:   1    Standing Expiration Date:   08/25/2019  . Flow cytometry panel-leukemia/lymphoma work-up    Standing Status:   Future    Number of Occurrences:   1    Standing Expiration Date:   08/25/2019  . Lactate dehydrogenase    Standing Status:   Future    Number of Occurrences:   1    Standing Expiration Date:   08/25/2019  . CBC with Differential/Platelet     Standing Status:   Future    Standing Expiration Date:   08/25/2019  . Comprehensive metabolic panel    Standing Status:   Future    Standing Expiration Date:   08/25/2019    All questions were answered. The patient knows to call the clinic with any problems questions or concerns.  Return of visit: 3 months with lab and MD assessment. Dr. Susy Manor, Thank you for this kind referral and the opportunity to participate in the care of this patient. A copy of today's note is routed to referring provider    Earlie Server, MD, PhD Hematology Oncology [facility] Pager- [phone] 08/26/2018

## 2018-08-28 ENCOUNTER — Encounter: Payer: Self-pay | Admitting: Nurse Practitioner

## 2018-08-28 ENCOUNTER — Ambulatory Visit (INDEPENDENT_AMBULATORY_CARE_PROVIDER_SITE_OTHER): Payer: Managed Care, Other (non HMO) | Admitting: Nurse Practitioner

## 2018-08-28 ENCOUNTER — Other Ambulatory Visit: Payer: Self-pay

## 2018-08-28 VITALS — BP 128/80 | HR 70 | Temp 97.0°F | Ht 67.0 in | Wt 140.0 lb

## 2018-08-28 DIAGNOSIS — R0981 Nasal congestion: Secondary | ICD-10-CM

## 2018-08-28 DIAGNOSIS — R5383 Other fatigue: Secondary | ICD-10-CM | POA: Diagnosis not present

## 2018-08-28 DIAGNOSIS — J3489 Other specified disorders of nose and nasal sinuses: Secondary | ICD-10-CM | POA: Diagnosis not present

## 2018-08-28 MED ORDER — AMOXICILLIN-POT CLAVULANATE 875-125 MG PO TABS: 1.0000 | ORAL_TABLET | Freq: Two times a day (BID) | ORAL | 0 refills | Status: DC

## 2018-08-28 MED ORDER — FLUTICASONE PROPIONATE 50 MCG/ACT NA SUSP: 2.0000 | Freq: Every day | NASAL | 6 refills | Status: DC

## 2018-08-28 NOTE — Progress Notes (Signed)
Virtual Visit via Video Note  I connected with Nathan Mack on 08/28/18 at 11:00 AM EDT by a video enabled telemedicine application and verified that I am speaking with the correct person using two identifiers.   Staff discussed the limitations of evaluation and management by telemedicine and the availability of in person appointments. The patient expressed understanding and agreed to proceed.  Patient location: home  My location: home office Other people present: Shayaan Parke, wife.  HPI  Patient seen for one week follow up on decreased appetite, fatigue and weight loss was referred to oncology and GI with plan for checking checking CBC, CMP, reticulocyte panel, flow cytometry, LDH, and additional screening lung CT, colonoscopy, EGD when processes resume during pandemic. States the last three days he has been really drained, has been slowly increasing activity and he is able to tolerate it but require frequent naps and rest.   Patient has been noting watery eyes, runny nose and sinus pressure ongoing for over a month. Instructed to take daily antihistamine  PHQ2/9: Depression screen [facility] 2/9 08/28/2018 08/21/2018 08/18/2018 02/06/2018 08/06/2017  Decreased Interest 0 0 0 0 0  Down, Depressed, Hopeless 0 0 0 0 0  PHQ - 2 Score 0 0 0 0 0  Altered sleeping 0 0 0 0 -  Tired, decreased energy 0 0 0 0 -  Change in appetite 0 0 0 0 -  Feeling bad or failure about yourself  0 0 0 0 -  Trouble concentrating 0 0 0 0 -  Moving slowly or fidgety/restless 0 0 0 0 -  Suicidal thoughts 0 0 0 0 -  PHQ-9 Score 0 0 0 0 -  Difficult doing work/chores Not difficult at all - Not difficult at all Not difficult at all -    PHQ reviewed. Negative  Patient Active Problem List   Diagnosis Date Noted  . Dilantin toxicity 08/13/2018  . COPD (chronic obstructive pulmonary disease) ([place]) 02/06/2018  . Benign neoplasm of descending colon   . Benign neoplasm of transverse colon   . Benign neoplasm of ascending colon   .  Gastritis without bleeding   . Other diseases of stomach and duodenum   . Need for vaccination 08/06/2017  . Family history of colon cancer in mother 08/06/2017  . Wellness examination 11/14/2015  . Tobacco abuse counseling 02/16/2015  . Encounter for annual health examination 11/10/2014  . Seizure disorder, grand mal ([place]) 11/10/2014  . Snoring 11/10/2014    Past Medical History:  Diagnosis Date  . COPD (chronic obstructive pulmonary disease) ([place])   . Seizures ([place])     Past Surgical History:  Procedure Laterality Date  . COLONOSCOPY WITH PROPOFOL N/A 09/09/2017   Procedure: COLONOSCOPY WITH PROPOFOL;  Surgeon: Lucilla Lame, MD;  Location: [facility] ENDOSCOPY;  Service: Endoscopy;  Laterality: N/A;  . ESOPHAGOGASTRODUODENOSCOPY (EGD) WITH PROPOFOL N/A 09/09/2017   Procedure: ESOPHAGOGASTRODUODENOSCOPY (EGD) WITH PROPOFOL;  Surgeon: Lucilla Lame, MD;  Location: [facility] ENDOSCOPY;  Service: Endoscopy;  Laterality: N/A;  . LUMBAR [place]    . TONSILLECTOMY      Social History   Tobacco Use  . Smoking status: Current Every Day Smoker    Packs/day: 0.50    Years: 40.00    Pack years: 20.00    Types: Cigarettes  . Smokeless tobacco: Never Used  Substance Use Topics  . Alcohol use: No    Alcohol/week: 0.0 standard drinks     Current Outpatient Medications:  .  calcium carbonate (CALCIUM 600) 1500 (  600 Ca) MG TABS tablet, Take 1,500 mg by mouth daily., Disp: , Rfl:  .  Cholecalciferol (VITAMIN D-3) 125 MCG (5000 UT) TABS, Take 5,000 Units by mouth daily., Disp: , Rfl:  .  Ferrous Sulfate (IRON SUPPLEMENT PO), Take 1 tablet by mouth once a week. , Disp: , Rfl:  .  folic acid (FOLVITE) 037 MCG tablet, Take 800 mcg by mouth daily. , Disp: , Rfl:  .  loratadine (CLARITIN) 10 MG tablet, Take 1 tablet (10 mg total) by mouth daily., Disp: 30 tablet, Rfl: 2 .  montelukast (SINGULAIR) 10 MG tablet, Take 1 tablet (10 mg total) by mouth at bedtime., Disp: 30 tablet, Rfl: 0 .  phenytoin  (DILANTIN) 100 MG ER capsule, Take 3 capsules (300 mg total) by mouth at bedtime., Disp: 360 capsule, Rfl: 1 .  tamsulosin (FLOMAX) 0.4 MG CAPS capsule, Take 1 capsule (0.4 mg total) by mouth daily., Disp: 7 capsule, Rfl: 0 .  vitamin B-12 (CYANOCOBALAMIN) 1000 MCG tablet, Take 1,000 mcg by mouth daily., Disp: , Rfl:   Allergies  Allergen Reactions  . Codeine   . Hydromorphone     ROS   No other specific complaints in a complete review of systems (except as listed in HPI above).  Objective  Vitals:   08/28/18 1022  BP: 128/80  Pulse: 70  Temp: (!) 97 F (36.1 C)  Weight: 140 lb (63.5 kg)  Height: 5\' 7"  (1.702 m)     Body mass index is 21.93 kg/m.  Nursing Note and Vital Signs reviewed.  Physical Exam   Constitutional: Patient appears well-developed and well-nourished. No distress.  HENT: Head: Normocephalic and atraumatic. Cardiovascular: Normal rate Pulmonary/Chest: Effort normal  Musculoskeletal: Normal range of motion,  Neurological: alert and oriented, speech normal.  Skin: No rash noted. No erythema.  Psychiatric: Patient has a normal mood and affect. behavior is normal. Judgment and thought content normal.    Assessment & Plan  1. Fatigue, unspecified type Still present but improving, continue follow-up with hematology and GI, still waiting to hear from PT. Discussed with patient plan to return to work on Monday with reduced hours and work restrictions to slowly build up endurance.  - amoxicillin-clavulanate (AUGMENTIN) 875-125 MG tablet; Take 1 tablet by mouth 2 (two) times daily.  Dispense: 20 tablet; Refill: 0  2. Sinus pressure If symptoms do not improve with flonase in the next 3 days can start antibiotic and take for the entire course.  - amoxicillin-clavulanate (AUGMENTIN) 875-125 MG tablet; Take 1 tablet by mouth 2 (two) times daily.  Dispense: 20 tablet; Refill: 0  3. Nasal congestion - fluticasone (FLONASE) 50 MCG/ACT nasal spray; Place 2  sprays into both nostrils daily.  Dispense: 16 g; Refill: 6 - amoxicillin-clavulanate (AUGMENTIN) 875-125 MG tablet; Take 1 tablet by mouth 2 (two) times daily.  Dispense: 20 tablet; Refill: 0    Follow Up Instructions: Keep appt on 5/20   I discussed the assessment and treatment plan with the patient. The patient was provided an opportunity to ask questions and all were answered. The patient agreed with the plan and demonstrated an understanding of the instructions.   The patient was advised to call back or seek an in-person evaluation if the symptoms worsen or if the condition fails to improve as anticipated.  I provided 22 minutes of non-face-to-face time during this encounter.   Fredderick Severance, NP

## 2018-08-31 ENCOUNTER — Other Ambulatory Visit: Payer: Self-pay

## 2018-08-31 ENCOUNTER — Telehealth: Payer: Self-pay

## 2018-08-31 NOTE — Telephone Encounter (Signed)
Pt stated he wanted to return to work on 09/01/18. Spoke with Benjamine Mola, NP, work note written, signed and placed up front.

## 2018-08-31 NOTE — Telephone Encounter (Signed)
If he is unable to go back to work full-time have him send in [employer] paperwork. Can you also check on PT referral.

## 2018-08-31 NOTE — Telephone Encounter (Signed)
Copied from [place] [id number]. Topic: Quick Communication - Other Results (Clinic Use ONLY) >> Aug 31, 2018 11:32 AM Karene Fry P wrote: Pt asking for a return call. He would like to discuss possible going back to work full time. If he can not go back full time he will lose his insurance. If possible please do it no later than tomorrow after 11am [phone]

## 2018-09-01 LAB — COMP PANEL: LEUKEMIA/LYMPHOMA

## 2018-09-09 ENCOUNTER — Ambulatory Visit (INDEPENDENT_AMBULATORY_CARE_PROVIDER_SITE_OTHER): Payer: Managed Care, Other (non HMO) | Admitting: Nurse Practitioner

## 2018-09-09 ENCOUNTER — Other Ambulatory Visit: Payer: Self-pay

## 2018-09-09 ENCOUNTER — Encounter: Payer: Self-pay | Admitting: Nurse Practitioner

## 2018-09-09 VITALS — BP 133/77 | HR 65 | Temp 94.6°F | Ht 67.0 in | Wt 140.0 lb

## 2018-09-09 DIAGNOSIS — G40409 Other generalized epilepsy and epileptic syndromes, not intractable, without status epilepticus: Secondary | ICD-10-CM

## 2018-09-09 DIAGNOSIS — J449 Chronic obstructive pulmonary disease, unspecified: Secondary | ICD-10-CM

## 2018-09-09 DIAGNOSIS — J302 Other seasonal allergic rhinitis: Secondary | ICD-10-CM

## 2018-09-09 MED ORDER — PHENYTOIN SODIUM EXTENDED 100 MG PO CAPS: 300.0000 mg | ORAL_CAPSULE | Freq: Every day | ORAL | 1 refills | Status: DC

## 2018-09-09 NOTE — Progress Notes (Signed)
Virtual Visit via Video Note  I connected with Nathan Mack on 09/09/18 at  8:20 AM EDT by a video enabled telemedicine application and verified that I am speaking with the correct person using two identifiers.   Staff discussed the limitations of evaluation and management by telemedicine and the availability of in person appointments. The patient expressed understanding and agreed to proceed.  Patient location: home  My location: home office Other people present: none HPI  COPD Patient smokes about 0.5-1 ppd  Endorses dry chronic cough.  Denies shortness of breath, wheezing   Seizure disorder Has not had a seizure in over 30 years. Had a recent accidental dilantin overdose and was hospitalized. Due to continuing weakness and gait difficulties he was referred to neurology. He is taking Dilantin 300mg  ER daily. Has been authorized for [facility] clinic neurology but not phone call yet.    Seasonal allergies Takes Claritin and flonase PRN   Patient states he feels much better, has gone back to work. States his legs don't hurt and is no longer unsteady on his feet. Denies headaches, weakness, nausea, dizziness, dysuria, sinus congestion and pain. States has been very active caring for the lawn and doing janitorial work full-time again.   PHQ2/9: Depression screen [facility] 2/9 09/09/2018 08/28/2018 08/21/2018 08/18/2018 02/06/2018  Decreased Interest 0 0 0 0 0  Down, Depressed, Hopeless 0 0 0 0 0  PHQ - 2 Score 0 0 0 0 0  Altered sleeping 0 0 0 0 0  Tired, decreased energy 0 0 0 0 0  Change in appetite 0 0 0 0 0  Feeling bad or failure about yourself  0 0 0 0 0  Trouble concentrating 0 0 0 0 0  Moving slowly or fidgety/restless 0 0 0 0 0  Suicidal thoughts 0 0 0 0 0  PHQ-9 Score 0 0 0 0 0  Difficult doing work/chores Not difficult at all Not difficult at all - Not difficult at all Not difficult at all     PHQ reviewed. Negative  Patient Active Problem List   Diagnosis Date Noted  . Dilantin  toxicity 08/13/2018  . COPD (chronic obstructive pulmonary disease) ([place]) 02/06/2018  . Benign neoplasm of descending colon   . Benign neoplasm of transverse colon   . Benign neoplasm of ascending colon   . Gastritis without bleeding   . Other diseases of stomach and duodenum   . Need for vaccination 08/06/2017  . Family history of colon cancer in mother 08/06/2017  . Wellness examination 11/14/2015  . Tobacco abuse counseling 02/16/2015  . Encounter for annual health examination 11/10/2014  . Seizure disorder, grand mal ([place]) 11/10/2014  . Snoring 11/10/2014    Past Medical History:  Diagnosis Date  . COPD (chronic obstructive pulmonary disease) ([place])   . Seizures ([place])     Past Surgical History:  Procedure Laterality Date  . COLONOSCOPY WITH PROPOFOL N/A 09/09/2017   Procedure: COLONOSCOPY WITH PROPOFOL;  Surgeon: Lucilla Lame, MD;  Location: [facility] ENDOSCOPY;  Service: Endoscopy;  Laterality: N/A;  . ESOPHAGOGASTRODUODENOSCOPY (EGD) WITH PROPOFOL N/A 09/09/2017   Procedure: ESOPHAGOGASTRODUODENOSCOPY (EGD) WITH PROPOFOL;  Surgeon: Lucilla Lame, MD;  Location: [facility] ENDOSCOPY;  Service: Endoscopy;  Laterality: N/A;  . LUMBAR [place]    . TONSILLECTOMY      Social History   Tobacco Use  . Smoking status: Current Every Day Smoker    Packs/day: 0.50    Years: 40.00    Pack years: 20.00  Types: Cigarettes  . Smokeless tobacco: Never Used  Substance Use Topics  . Alcohol use: No    Alcohol/week: 0.0 standard drinks     Current Outpatient Medications:  .  calcium carbonate (CALCIUM 600) 1500 (600 Ca) MG TABS tablet, Take 1,500 mg by mouth daily., Disp: , Rfl:  .  Cholecalciferol (VITAMIN D-3) 125 MCG (5000 UT) TABS, Take 5,000 Units by mouth daily., Disp: , Rfl:  .  Ferrous Sulfate (IRON SUPPLEMENT PO), Take 1 tablet by mouth once a week. , Disp: , Rfl:  .  fluticasone (FLONASE) 50 MCG/ACT nasal spray, Place 2 sprays into both nostrils daily., Disp: 16 g, Rfl: 6 .   folic acid (FOLVITE) 846 MCG tablet, Take 800 mcg by mouth daily. , Disp: , Rfl:  .  loratadine (CLARITIN) 10 MG tablet, Take 1 tablet (10 mg total) by mouth daily., Disp: 30 tablet, Rfl: 2 .  montelukast (SINGULAIR) 10 MG tablet, Take 1 tablet (10 mg total) by mouth at bedtime., Disp: 30 tablet, Rfl: 0 .  phenytoin (DILANTIN) 100 MG ER capsule, Take 3 capsules (300 mg total) by mouth at bedtime., Disp: 360 capsule, Rfl: 1 .  tamsulosin (FLOMAX) 0.4 MG CAPS capsule, Take 1 capsule (0.4 mg total) by mouth daily., Disp: 7 capsule, Rfl: 0 .  vitamin B-12 (CYANOCOBALAMIN) 1000 MCG tablet, Take 1,000 mcg by mouth daily., Disp: , Rfl:   Allergies  Allergen Reactions  . Codeine   . Hydromorphone     ROS   No other specific complaints in a complete review of systems (except as listed in HPI above).  Objective  Vitals:   09/09/18 0816  BP: 133/77  Pulse: 65  Temp: (!) 94.6 F (34.8 C)  TempSrc: Oral  Weight: 140 lb (63.5 kg)  Height: 5\' 7"  (1.702 m)     Body mass index is 21.93 kg/m.  Nursing Note and Vital Signs reviewed.  Physical Exam  Constitutional: Patient appears well-developed and well-nourished. No distress.  HENT: Head: Normocephalic and atraumatic. Cardiovascular: Normal rate Pulmonary/Chest: Effort normal  Musculoskeletal: Normal range of motion,  Neurological: alert and oriented, speech normal.  Skin: No rash noted. No erythema.  Psychiatric: Patient has a normal mood and affect. behavior is normal. Judgment and thought content normal.    Assessment & Plan  1. Chronic obstructive pulmonary disease, unspecified COPD type ([place]) Asymptomatic, encouraged to cut down smoking   2. Seizure disorder, grand mal ([place]) Referred to neurology  - phenytoin (DILANTIN) 100 MG ER capsule; Take 3 capsules (300 mg total) by mouth at bedtime.  Dispense: 360 capsule; Refill: 1  3. Seasonal allergies Well controlled.  Discussed can cancel PT as he is back to baseline     Follow Up Instructions:   4 months  I discussed the assessment and treatment plan with the patient. The patient was provided an opportunity to ask questions and all were answered. The patient agreed with the plan and demonstrated an understanding of the instructions.   The patient was advised to call back or seek an in-person evaluation if the symptoms worsen or if the condition fails to improve as anticipated.  I provided 18 minutes of non-face-to-face time during this encounter.   Fredderick Severance, NP

## 2018-09-12 ENCOUNTER — Other Ambulatory Visit: Payer: Self-pay | Admitting: Nurse Practitioner

## 2018-09-16 ENCOUNTER — Telehealth: Payer: Self-pay | Admitting: *Deleted

## 2018-09-16 DIAGNOSIS — Z87891 Personal history of nicotine dependence: Secondary | ICD-10-CM

## 2018-09-16 DIAGNOSIS — Z122 Encounter for screening for malignant neoplasm of respiratory organs: Secondary | ICD-10-CM

## 2018-09-16 NOTE — Telephone Encounter (Signed)
Received referral for low dose lung cancer screening CT scan. Message left at phone number listed in EMR for patient to call me back to facilitate scheduling scan.  

## 2018-09-16 NOTE — Telephone Encounter (Signed)
Received referral for initial lung cancer screening scan. Contacted patient and obtained smoking history,(current, 45 pack year) as well as answering questions related to screening process. Patient denies signs of lung cancer such as weight loss or hemoptysis. Patient denies comorbidity that would prevent curative treatment if lung cancer were found. Patient is scheduled for shared decision making visit and CT scan on 09/24/18 at 2pm.

## 2018-09-22 ENCOUNTER — Other Ambulatory Visit: Payer: Self-pay

## 2018-09-22 DIAGNOSIS — R634 Abnormal weight loss: Secondary | ICD-10-CM

## 2018-09-22 DIAGNOSIS — D500 Iron deficiency anemia secondary to blood loss (chronic): Secondary | ICD-10-CM

## 2018-09-24 ENCOUNTER — Inpatient Hospital Stay: Payer: Managed Care, Other (non HMO) | Attending: Nurse Practitioner | Admitting: Hospice and Palliative Medicine

## 2018-09-24 ENCOUNTER — Ambulatory Visit: Admission: RE | Admit: 2018-09-24 | Payer: Managed Care, Other (non HMO) | Source: Ambulatory Visit

## 2018-09-24 ENCOUNTER — Other Ambulatory Visit: Payer: Self-pay | Admitting: Nurse Practitioner

## 2018-09-24 ENCOUNTER — Encounter: Payer: Self-pay | Admitting: *Deleted

## 2018-09-24 ENCOUNTER — Ambulatory Visit
Admission: RE | Admit: 2018-09-24 | Discharge: 2018-09-24 | Disposition: A | Discharge status: Home / Self Care | Payer: Managed Care, Other (non HMO) | Source: Ambulatory Visit | Attending: Nurse Practitioner | Admitting: Nurse Practitioner

## 2018-09-24 ENCOUNTER — Other Ambulatory Visit: Payer: Self-pay

## 2018-09-24 DIAGNOSIS — Z122 Encounter for screening for malignant neoplasm of respiratory organs: Secondary | ICD-10-CM | POA: Insufficient documentation

## 2018-09-24 DIAGNOSIS — Z87891 Personal history of nicotine dependence: Secondary | ICD-10-CM

## 2018-09-24 NOTE — Progress Notes (Signed)
In accordance with CMS guidelines, patient has met eligibility criteria including age, absence of signs or symptoms of lung cancer.  Social History   Tobacco Use  . Smoking status: Current Every Day Smoker    Packs/day: 1.00    Years: 45.00    Pack years: 45.00    Types: Cigarettes  . Smokeless tobacco: Never Used  Substance Use Topics  . Alcohol use: No    Alcohol/week: 0.0 standard drinks  . Drug use: No      A shared decision-making session was conducted prior to the performance of CT scan. This includes one or more decision aids, includes benefits and harms of screening, follow-up diagnostic testing, over-diagnosis, false positive rate, and total radiation exposure.   Counseling on the importance of adherence to annual lung cancer LDCT screening, impact of co-morbidities, and ability or willingness to undergo diagnosis and treatment is imperative for compliance of the program.   Counseling on the importance of continued smoking cessation for former smokers; the importance of smoking cessation for current smokers, and information about tobacco cessation interventions have been given to patient including [place] and 1800 quit [place] programs.   Written order for lung cancer screening with LDCT has been given to the patient and any and all questions have been answered to the best of my abilities.    Yearly follow up will be coordinated by Burgess Estelle, Thoracic Navigator.  Time Total: 15 minutes  Visit consisted of counseling and education dealing with complex health screening. Greater than 50%  of this time was spent counseling and coordinating care related to the above assessment and plan.  Signed by: Altha Harm, PhD, NP-C [phone] (Work Cell)

## 2018-09-29 DIAGNOSIS — Z87898 Personal history of other specified conditions: Secondary | ICD-10-CM | POA: Insufficient documentation

## 2018-09-30 ENCOUNTER — Encounter: Payer: Self-pay | Admitting: *Deleted

## 2018-10-01 ENCOUNTER — Other Ambulatory Visit: Payer: Self-pay | Admitting: Neurology

## 2018-10-01 DIAGNOSIS — Z87898 Personal history of other specified conditions: Secondary | ICD-10-CM

## 2018-10-15 ENCOUNTER — Other Ambulatory Visit
Admission: RE | Admit: 2018-10-15 | Discharge: 2018-10-15 | Disposition: A | Discharge status: Home / Self Care | Payer: Managed Care, Other (non HMO) | Source: Ambulatory Visit | Attending: Gastroenterology | Admitting: Gastroenterology

## 2018-10-15 ENCOUNTER — Other Ambulatory Visit: Payer: Self-pay

## 2018-10-15 DIAGNOSIS — Z1159 Encounter for screening for other viral diseases: Secondary | ICD-10-CM | POA: Diagnosis not present

## 2018-10-16 ENCOUNTER — Other Ambulatory Visit: Payer: Managed Care, Other (non HMO)

## 2018-10-16 LAB — NOVEL CORONAVIRUS, NAA (HOSP ORDER, SEND-OUT TO REF LAB; TAT 18-24 HRS): SARS-CoV-2, NAA: NOT DETECTED

## 2018-10-17 ENCOUNTER — Ambulatory Visit
Admission: RE | Admit: 2018-10-17 | Discharge: 2018-10-17 | Disposition: A | Discharge status: Home / Self Care | Payer: Managed Care, Other (non HMO) | Source: Ambulatory Visit | Attending: Neurology | Admitting: Neurology

## 2018-10-17 ENCOUNTER — Other Ambulatory Visit: Payer: Self-pay

## 2018-10-17 DIAGNOSIS — Z87898 Personal history of other specified conditions: Secondary | ICD-10-CM | POA: Diagnosis not present

## 2018-10-19 ENCOUNTER — Telehealth: Payer: Self-pay | Admitting: Gastroenterology

## 2018-10-19 ENCOUNTER — Other Ambulatory Visit: Payer: Self-pay

## 2018-10-19 DIAGNOSIS — R634 Abnormal weight loss: Secondary | ICD-10-CM

## 2018-10-19 DIAGNOSIS — D500 Iron deficiency anemia secondary to blood loss (chronic): Secondary | ICD-10-CM

## 2018-10-19 NOTE — Telephone Encounter (Signed)
Patient call & stated that his wife wanted to be in the waiting room at his procedure. He was explained that due to Covid that was not possible. Then since she could not be there to help with her anxiety he would need to cancel.

## 2018-10-19 NOTE — Telephone Encounter (Signed)
He need to have the procedure. His wife can be just outside the waiting room in her car.

## 2018-10-19 NOTE — Telephone Encounter (Signed)
Pt notified per Dr Allen Norris due to the abnormal weight loss and iron deficiency anemia, he really needs the colonoscopy done. Pt has rescheduled for August 11th.

## 2018-10-19 NOTE — Telephone Encounter (Signed)
Trich with Endo called to cancel upper & lower endo for 10-20-18 with Dr Allen Norris stating patient called there. He told her since his wife was unable to come to the appt she was crying & her could not have that so he was going to cancel.

## 2018-10-19 NOTE — Telephone Encounter (Signed)
See messages below and review previous colonoscopy report. This was a 1 year repeat. Let me know if he can wait or this needs to be scheduled right away.

## 2018-10-20 ENCOUNTER — Encounter: Admission: RE | Payer: Self-pay | Source: Home / Self Care

## 2018-10-20 ENCOUNTER — Ambulatory Visit
Admission: RE | Admit: 2018-10-20 | Payer: Managed Care, Other (non HMO) | Source: Home / Self Care | Admitting: Gastroenterology

## 2018-10-20 SURGERY — COLONOSCOPY WITH PROPOFOL
Anesthesia: General

## 2018-11-12 ENCOUNTER — Other Ambulatory Visit: Payer: Self-pay | Admitting: Nurse Practitioner

## 2018-11-12 DIAGNOSIS — J302 Other seasonal allergic rhinitis: Secondary | ICD-10-CM

## 2018-11-23 ENCOUNTER — Inpatient Hospital Stay: Payer: Managed Care, Other (non HMO) | Admitting: Oncology

## 2018-11-23 ENCOUNTER — Inpatient Hospital Stay: Payer: Managed Care, Other (non HMO)

## 2018-11-27 ENCOUNTER — Other Ambulatory Visit: Payer: Self-pay

## 2018-11-27 ENCOUNTER — Other Ambulatory Visit
Admission: RE | Admit: 2018-11-27 | Discharge: 2018-11-27 | Disposition: A | Discharge status: Home / Self Care | Payer: Managed Care, Other (non HMO) | Source: Ambulatory Visit | Attending: Gastroenterology | Admitting: Gastroenterology

## 2018-11-27 DIAGNOSIS — Z01812 Encounter for preprocedural laboratory examination: Secondary | ICD-10-CM | POA: Diagnosis present

## 2018-11-27 DIAGNOSIS — Z20828 Contact with and (suspected) exposure to other viral communicable diseases: Secondary | ICD-10-CM | POA: Diagnosis not present

## 2018-11-28 LAB — SARS CORONAVIRUS 2 (TAT 6-24 HRS): SARS Coronavirus 2: NEGATIVE

## 2018-12-01 ENCOUNTER — Ambulatory Visit: Payer: Managed Care, Other (non HMO) | Admitting: Certified Registered"

## 2018-12-01 ENCOUNTER — Encounter
Admission: RE | Disposition: A | Discharge status: Home / Self Care | Payer: Self-pay | Source: Home / Self Care | Attending: Gastroenterology

## 2018-12-01 ENCOUNTER — Ambulatory Visit
Admission: RE | Admit: 2018-12-01 | Discharge: 2018-12-01 | Disposition: A | Discharge status: Home / Self Care | Payer: Managed Care, Other (non HMO) | Attending: Gastroenterology | Admitting: Gastroenterology

## 2018-12-01 ENCOUNTER — Other Ambulatory Visit: Payer: Self-pay

## 2018-12-01 ENCOUNTER — Encounter: Payer: Self-pay | Admitting: *Deleted

## 2018-12-01 DIAGNOSIS — R634 Abnormal weight loss: Secondary | ICD-10-CM | POA: Diagnosis not present

## 2018-12-01 DIAGNOSIS — Z79899 Other long term (current) drug therapy: Secondary | ICD-10-CM | POA: Diagnosis not present

## 2018-12-01 DIAGNOSIS — K2950 Unspecified chronic gastritis without bleeding: Secondary | ICD-10-CM | POA: Diagnosis not present

## 2018-12-01 DIAGNOSIS — D500 Iron deficiency anemia secondary to blood loss (chronic): Secondary | ICD-10-CM

## 2018-12-01 DIAGNOSIS — D122 Benign neoplasm of ascending colon: Secondary | ICD-10-CM | POA: Diagnosis not present

## 2018-12-01 DIAGNOSIS — F17210 Nicotine dependence, cigarettes, uncomplicated: Secondary | ICD-10-CM | POA: Insufficient documentation

## 2018-12-01 DIAGNOSIS — Z8719 Personal history of other diseases of the digestive system: Secondary | ICD-10-CM | POA: Insufficient documentation

## 2018-12-01 DIAGNOSIS — K2980 Duodenitis without bleeding: Secondary | ICD-10-CM | POA: Diagnosis not present

## 2018-12-01 DIAGNOSIS — K640 First degree hemorrhoids: Secondary | ICD-10-CM | POA: Insufficient documentation

## 2018-12-01 DIAGNOSIS — R569 Unspecified convulsions: Secondary | ICD-10-CM | POA: Diagnosis not present

## 2018-12-01 DIAGNOSIS — K635 Polyp of colon: Secondary | ICD-10-CM

## 2018-12-01 DIAGNOSIS — K2900 Acute gastritis without bleeding: Secondary | ICD-10-CM | POA: Diagnosis not present

## 2018-12-01 DIAGNOSIS — Z885 Allergy status to narcotic agent status: Secondary | ICD-10-CM | POA: Insufficient documentation

## 2018-12-01 DIAGNOSIS — D125 Benign neoplasm of sigmoid colon: Secondary | ICD-10-CM | POA: Diagnosis not present

## 2018-12-01 DIAGNOSIS — J449 Chronic obstructive pulmonary disease, unspecified: Secondary | ICD-10-CM | POA: Diagnosis not present

## 2018-12-01 HISTORY — PX: ESOPHAGOGASTRODUODENOSCOPY (EGD) WITH PROPOFOL: SHX5813

## 2018-12-01 HISTORY — PX: COLONOSCOPY WITH PROPOFOL: SHX5780

## 2018-12-01 SURGERY — COLONOSCOPY WITH PROPOFOL
Anesthesia: General

## 2018-12-01 MED ORDER — PROPOFOL 10 MG/ML IV BOLUS
INTRAVENOUS | Status: DC | PRN
  Administered 2018-12-01: 40 mg via INTRAVENOUS
  Administered 2018-12-01: 20 mg via INTRAVENOUS
  Administered 2018-12-01: 30 mg via INTRAVENOUS

## 2018-12-01 MED ORDER — PHENYLEPHRINE HCL (PRESSORS) 10 MG/ML IV SOLN
INTRAVENOUS | Status: DC | PRN
  Administered 2018-12-01: 100 ug via INTRAVENOUS

## 2018-12-01 MED ORDER — FENTANYL CITRATE (PF) 100 MCG/2ML IJ SOLN
INTRAMUSCULAR | Status: DC | PRN
  Administered 2018-12-01: 25 ug via INTRAVENOUS

## 2018-12-01 MED ORDER — SODIUM CHLORIDE 0.9 % IV SOLN
INTRAVENOUS | Status: DC
  Administered 2018-12-01: 1000 mL via INTRAVENOUS

## 2018-12-01 MED ORDER — FENTANYL CITRATE (PF) 100 MCG/2ML IJ SOLN
INTRAMUSCULAR | Status: AC
  Filled 2018-12-01: qty 2

## 2018-12-01 MED ORDER — GLYCOPYRROLATE 0.2 MG/ML IJ SOLN
INTRAMUSCULAR | Status: DC | PRN
  Administered 2018-12-01: 0.2 mg via INTRAVENOUS

## 2018-12-01 MED ORDER — GLYCOPYRROLATE 0.2 MG/ML IJ SOLN
INTRAMUSCULAR | Status: AC
  Filled 2018-12-01: qty 1

## 2018-12-01 MED ORDER — PROPOFOL 500 MG/50ML IV EMUL
INTRAVENOUS | Status: DC | PRN
  Administered 2018-12-01: 100 ug/kg/min via INTRAVENOUS

## 2018-12-01 MED ORDER — EPHEDRINE SULFATE 50 MG/ML IJ SOLN
INTRAMUSCULAR | Status: AC
  Filled 2018-12-01: qty 1

## 2018-12-01 MED ORDER — LIDOCAINE HCL (PF) 2 % IJ SOLN
INTRAMUSCULAR | Status: AC
  Filled 2018-12-01: qty 10

## 2018-12-01 MED ORDER — PROPOFOL 10 MG/ML IV BOLUS
INTRAVENOUS | Status: AC
  Filled 2018-12-01: qty 40

## 2018-12-01 MED ORDER — EPHEDRINE SULFATE 50 MG/ML IJ SOLN
INTRAMUSCULAR | Status: DC | PRN
  Administered 2018-12-01: 5 mg via INTRAVENOUS

## 2018-12-01 NOTE — Anesthesia Postprocedure Evaluation (Signed)
Anesthesia Post Note  Patient: Nathan Mack  Procedure(s) Performed: COLONOSCOPY WITH PROPOFOL (N/A ) ESOPHAGOGASTRODUODENOSCOPY (EGD) WITH PROPOFOL (N/A )  Patient location during evaluation: Endoscopy Anesthesia Type: General Level of consciousness: awake and alert Pain management: pain level controlled Vital Signs Assessment: post-procedure vital signs reviewed and stable Respiratory status: spontaneous breathing and respiratory function stable Cardiovascular status: stable Anesthetic complications: no     Last Vitals:  Vitals:   12/01/18 0834 12/01/18 0930  BP: 119/82 99/66  Pulse: (!) 107 72  Resp: 18 11  Temp: (!) 36.4 C (!) 36.4 C  SpO2: 96% 100%    Last Pain:  Vitals:   12/01/18 0834  TempSrc: Tympanic  PainSc: 0-No pain                 KEPHART,WILLIAM K

## 2018-12-01 NOTE — Op Note (Signed)
[facility] Gastroenterology Patient Name: Nathan Mack Procedure Date: 12/01/2018 9:12 AM MRN: [id number] Account #: [id number] Date of Birth: 1955-11-04 Admit Type: Outpatient Age: 63 Room: [facility] ENDO ROOM 4 Gender: Male Note Status: Finalized Procedure:            Colonoscopy Indications:          Weight loss Providers:            Lucilla Lame MD, MD Referring MD:         Arnetha Courser (Referring MD) Medicines:            Propofol per Anesthesia Complications:        No immediate complications. Procedure:            Pre-Anesthesia Assessment:                       - Prior to the procedure, a History and Physical was                        performed, and patient medications and allergies were                        reviewed. The patient's tolerance of previous                        anesthesia was also reviewed. The risks and benefits of                        the procedure and the sedation options and risks were                        discussed with the patient. All questions were                        answered, and informed consent was obtained. Prior                        Anticoagulants: The patient has taken no previous                        anticoagulant or antiplatelet agents. ASA Grade                        Assessment: II - A patient with mild systemic disease.                        After reviewing the risks and benefits, the patient was                        deemed in satisfactory condition to undergo the                        procedure.                       After obtaining informed consent, the colonoscope was                        passed under direct vision. Throughout the procedure,  the patient's blood pressure, pulse, and oxygen                        saturations were monitored continuously. The                        Colonoscope was introduced through the anus and                        advanced to the the cecum,  identified by appendiceal                        orifice and ileocecal valve. The colonoscopy was                        performed without difficulty. The patient tolerated the                        procedure well. The quality of the bowel preparation                        was excellent. Findings:      The perianal and digital rectal examinations were normal.      Two sessile polyps were found in the ascending colon. The polyps were 4       to 6 mm in size. These polyps were removed with a cold snare. Resection       and retrieval were complete.      A 3 mm polyp was found in the ascending colon. The polyp was sessile.       The polyp was removed with a cold biopsy forceps. Resection and       retrieval were complete.      A 5 mm polyp was found in the sigmoid colon. The polyp was sessile. The       polyp was removed with a cold snare. Resection and retrieval were       complete.      Non-bleeding internal hemorrhoids were found during retroflexion. The       hemorrhoids were Grade I (internal hemorrhoids that do not prolapse). Impression:           - Two 4 to 6 mm polyps in the ascending colon, removed                        with a cold snare. Resected and retrieved.                       - One 3 mm polyp in the ascending colon, removed with a                        cold biopsy forceps. Resected and retrieved.                       - One 5 mm polyp in the sigmoid colon, removed with a                        cold snare. Resected and retrieved.                       - Non-bleeding internal hemorrhoids. Recommendation:       -  Discharge patient to home.                       - Resume previous diet.                       - Continue present medications.                       - Await pathology results.                       - Repeat colonoscopy in 5 years for surveillance. Procedure Code(s):    --- Professional ---                       [phone], Colonoscopy, flexible; with removal of  tumor(s),                        polyp(s), or other lesion(s) by snare technique                       45380, 44, Colonoscopy, flexible; with biopsy, single                        or multiple Diagnosis Code(s):    --- Professional ---                       R63.4, Abnormal weight loss                       K63.5, Polyp of colon CPT copyright 2019 American Medical Association. All rights reserved. The codes documented in this report are preliminary and upon coder review may  be revised to meet current compliance requirements. Lucilla Lame MD, MD 12/01/2018 9:30:42 AM This report has been signed electronically. Number of Addenda: 0 Note Initiated On: 12/01/2018 9:12 AM Scope Withdrawal Time: 0 hours 6 minutes 56 seconds  Total Procedure Duration: 0 hours 11 minutes 40 seconds  Estimated Blood Loss: Estimated blood loss: none.      [facility]

## 2018-12-01 NOTE — Op Note (Signed)
[facility] Gastroenterology Patient Name: Nathan Mack Procedure Date: 12/01/2018 8:23 AM MRN: [id number] Account #: [id number] Date of Birth: 1955-10-04 Admit Type: Outpatient Age: 63 Room: [facility] ENDO ROOM 4 Gender: Male Note Status: Finalized Procedure:            Upper GI endoscopy Indications:          Weight loss Providers:            Lucilla Lame MD, MD Medicines:            Propofol per Anesthesia Complications:        No immediate complications. Procedure:            Pre-Anesthesia Assessment:                       - Prior to the procedure, a History and Physical was                        performed, and patient medications and allergies were                        reviewed. The patient's tolerance of previous                        anesthesia was also reviewed. The risks and benefits of                        the procedure and the sedation options and risks were                        discussed with the patient. All questions were                        answered, and informed consent was obtained. Prior                        Anticoagulants: The patient has taken no previous                        anticoagulant or antiplatelet agents. ASA Grade                        Assessment: II - A patient with mild systemic disease.                        After reviewing the risks and benefits, the patient was                        deemed in satisfactory condition to undergo the                        procedure.                       After obtaining informed consent, the endoscope was                        passed under direct vision. Throughout the procedure,                        the patient's blood pressure, pulse,  and oxygen                        saturations were monitored continuously. The Endoscope                        was introduced through the mouth, and advanced to the                        second part of duodenum. The upper GI endoscopy was             accomplished without difficulty. The patient tolerated                        the procedure well. Findings:      The examined esophagus was normal.      Diffuse moderate inflammation was found in the entire examined stomach.       Biopsies were taken with a cold forceps for histology.      Localized moderate inflammation characterized by erythema was found in       the duodenal bulb. Impression:           - Normal esophagus.                       - Gastritis. Biopsied.                       - Duodenitis. Recommendation:       - Discharge patient to home.                       - Resume previous diet.                       - Continue present medications.                       - Await pathology results.                       - Perform a colonoscopy today. Procedure Code(s):    --- Professional ---                       [phone], Esophagogastroduodenoscopy, flexible, transoral;                        with biopsy, single or multiple Diagnosis Code(s):    --- Professional ---                       R63.4, Abnormal weight loss                       K29.70, Gastritis, unspecified, without bleeding                       K29.80, Duodenitis without bleeding CPT copyright 2019 American Medical Association. All rights reserved. The codes documented in this report are preliminary and upon coder review may  be revised to meet current compliance requirements. Lucilla Lame MD, MD 12/01/2018 9:10:40 AM This report has been signed electronically. Number of Addenda: 0 Note Initiated On: 12/01/2018 8:23 AM Estimated Blood Loss: Estimated blood loss: none.      [facility]

## 2018-12-01 NOTE — Transfer of Care (Signed)
Immediate Anesthesia Transfer of Care Note  Patient: Nathan Mack  Procedure(s) Performed: COLONOSCOPY WITH PROPOFOL (N/A ) ESOPHAGOGASTRODUODENOSCOPY (EGD) WITH PROPOFOL (N/A )  Patient Location: PACU  Anesthesia Type:General  Level of Consciousness: sedated  Airway & Oxygen Therapy: Patient Spontanous Breathing and Patient connected to nasal cannula oxygen  Post-op Assessment: Report given to RN and Post -op Vital signs reviewed and stable  Post vital signs: Reviewed and stable  Last Vitals:  Vitals Value Taken Time  BP 99/66 12/01/18 0930  Temp 36.4 C 12/01/18 0930  Pulse 68 12/01/18 0931  Resp 10 12/01/18 0931  SpO2 100 % 12/01/18 0931  Vitals shown include unvalidated device data.  Last Pain:  Vitals:   12/01/18 0834  TempSrc: Tympanic  PainSc: 0-No pain         Complications: No apparent anesthesia complications

## 2018-12-01 NOTE — H&P (Signed)
Nathan Lame, MD [address] [place], [place] [address] Phone:[phone] Fax : [phone]  Primary Care Physician:  Arnetha Courser, MD Primary Gastroenterologist:  Dr. Allen Norris  Pre-Procedure History & Physical: HPI:  Nathan Mack is a 63 y.o. male is here for an endoscopy and colonoscopy.   Past Medical History:  Diagnosis Date  . COPD (chronic obstructive pulmonary disease) ([place])   . Seizures ([place])     Past Surgical History:  Procedure Laterality Date  . COLONOSCOPY WITH PROPOFOL N/A 09/09/2017   Procedure: COLONOSCOPY WITH PROPOFOL;  Surgeon: Nathan Lame, MD;  Location: [facility] ENDOSCOPY;  Service: Endoscopy;  Laterality: N/A;  . ESOPHAGOGASTRODUODENOSCOPY (EGD) WITH PROPOFOL N/A 09/09/2017   Procedure: ESOPHAGOGASTRODUODENOSCOPY (EGD) WITH PROPOFOL;  Surgeon: Nathan Lame, MD;  Location: [facility] ENDOSCOPY;  Service: Endoscopy;  Laterality: N/A;  . LUMBAR [place]    . TONSILLECTOMY      Prior to Admission medications   Medication Sig Start Date End Date Taking? Authorizing Provider  calcium carbonate (CALCIUM 600) 1500 (600 Ca) MG TABS tablet Take 1,500 mg by mouth daily.    [provider]  Cholecalciferol (VITAMIN D-3) 125 MCG (5000 UT) TABS Take 5,000 Units by mouth daily.    [provider]  Ferrous Sulfate (IRON SUPPLEMENT PO) Take 1 tablet by mouth once a week.     [provider]  fluticasone (FLONASE) 50 MCG/ACT nasal spray Place 2 sprays into both nostrils daily. 08/28/18   Poulose, Bethel Born, NP  folic acid (FOLVITE) 818 MCG tablet Take 800 mcg by mouth daily.     [provider]  loratadine (CLARITIN) 10 MG tablet TAKE 1 TABLET BY MOUTH EVERY DAY 11/12/18   Poulose, Bethel Born, NP  montelukast (SINGULAIR) 10 MG tablet TAKE 1 TABLET BY MOUTH EVERYDAY AT BEDTIME 09/24/18   Poulose, Bethel Born, NP  phenytoin (DILANTIN) 100 MG ER capsule Take 3 capsules (300 mg total) by mouth at bedtime. 09/09/18   Poulose, Bethel Born,  NP  vitamin B-12 (CYANOCOBALAMIN) 1000 MCG tablet Take 1,000 mcg by mouth daily.    [provider]    Allergies as of 10/20/2018 - Review Complete 09/09/2018  Allergen Reaction Noted  . Codeine  11/10/2014  . Hydromorphone  11/10/2014    Family History  Problem Relation Age of Onset  . Colon cancer Mother     Social History   Socioeconomic History  . Marital status: Married    Spouse name: Maudie Mercury  . Number of children: 0  . Years of education: Not on file  . Highest education level: Not on file  Occupational History  . Not on file  Social Needs  . Financial resource strain: Not hard at all  . Food insecurity    Worry: Never true    Inability: Never true  . Transportation needs    Medical: No    Non-medical: No  Tobacco Use  . Smoking status: Current Every Day Smoker    Packs/day: 1.00    Years: 45.00    Pack years: 45.00    Types: Cigarettes  . Smokeless tobacco: Never Used  Substance and Sexual Activity  . Alcohol use: No    Alcohol/week: 0.0 standard drinks  . Drug use: No  . Sexual activity: Yes    Partners: Female  Lifestyle  . Physical activity    Days per week: 5 days    Minutes per session: 30 min  . Stress: Not at all  Relationships  . Social  connections    Talks on phone: Three times a week    Gets together: Once a week    Attends religious service: Patient refused    Active member of club or organization: Patient refused    Attends meetings of clubs or organizations: Patient refused    Relationship status: Married  . Intimate partner violence    Fear of current or ex partner: No    Emotionally abused: No    Physically abused: No    Forced sexual activity: No  Other Topics Concern  . Not on file  Social History Narrative  . Not on file    Review of Systems: See HPI, otherwise negative ROS  Physical Exam: There were no vitals taken for this visit. General:   Alert,  pleasant and cooperative in NAD Head:  Normocephalic and  atraumatic. Neck:  Supple; no masses or thyromegaly. Lungs:  Clear throughout to auscultation.    Heart:  Regular rate and rhythm. Abdomen:  Soft, nontender and nondistended. Normal bowel sounds, without guarding, and without rebound.   Neurologic:  Alert and  oriented x4;  grossly normal neurologically.  Impression/Plan: [name] is here for an endoscopy and colonoscopy to be performed for weight loss large polyp 08/2017  Risks, benefits, limitations, and alternatives regarding  endoscopy and colonoscopy have been reviewed with the patient.  Questions have been answered.  All parties agreeable.   Nathan Lame, MD  12/01/2018, 8:31 AM

## 2018-12-01 NOTE — Anesthesia Preprocedure Evaluation (Addendum)
Anesthesia Evaluation  Patient identified by MRN, date of birth, ID band Patient awake    Reviewed: Allergy & Precautions, NPO status , Patient's Chart, lab work & pertinent test results  History of Anesthesia Complications Negative for: history of anesthetic complications  Airway Mallampati: I       Dental  (+) Upper Dentures, Lower Dentures   Pulmonary COPD,  COPD inhaler, Current Smoker,           Cardiovascular (-) hypertension(-) Past MI and (-) CHF (-) dysrhythmias (-) Valvular Problems/Murmurs     Neuro/Psych Seizures - (none in last 30 yrs, being weaned off meds), Well Controlled,     GI/Hepatic Neg liver ROS, neg GERD  ,  Endo/Other  neg diabetes  Renal/GU negative Renal ROS     Musculoskeletal   Abdominal   Peds  Hematology   Anesthesia Other Findings   Reproductive/Obstetrics                            Anesthesia Physical Anesthesia Plan  ASA: III  Anesthesia Plan: General   Post-op Pain Management:    Induction: Intravenous  PONV Risk Score and Plan: 1 and Propofol infusion and TIVA  Airway Management Planned: Nasal Cannula  Additional Equipment:   Intra-op Plan:   Post-operative Plan:   Informed Consent: I have reviewed the patients History and Physical, chart, labs and discussed the procedure including the risks, benefits and alternatives for the proposed anesthesia with the patient or authorized representative who has indicated his/her understanding and acceptance.       Plan Discussed with:   Anesthesia Plan Comments:         Anesthesia Quick Evaluation

## 2018-12-01 NOTE — Anesthesia Post-op Follow-up Note (Signed)
Anesthesia QCDR form completed.        

## 2018-12-02 ENCOUNTER — Encounter: Payer: Self-pay | Admitting: Gastroenterology

## 2018-12-03 LAB — SURGICAL PATHOLOGY

## 2018-12-05 ENCOUNTER — Encounter: Payer: Self-pay | Admitting: Gastroenterology

## 2018-12-14 ENCOUNTER — Inpatient Hospital Stay: Payer: Managed Care, Other (non HMO) | Attending: Oncology

## 2018-12-14 ENCOUNTER — Inpatient Hospital Stay: Payer: Managed Care, Other (non HMO) | Admitting: Oncology

## 2018-12-14 DIAGNOSIS — R569 Unspecified convulsions: Secondary | ICD-10-CM | POA: Insufficient documentation

## 2019-01-13 ENCOUNTER — Ambulatory Visit: Payer: Managed Care, Other (non HMO) | Admitting: Family Medicine

## 2019-01-14 ENCOUNTER — Other Ambulatory Visit: Payer: Self-pay

## 2019-01-14 ENCOUNTER — Encounter: Payer: Self-pay | Admitting: Family Medicine

## 2019-01-14 ENCOUNTER — Ambulatory Visit (INDEPENDENT_AMBULATORY_CARE_PROVIDER_SITE_OTHER): Payer: Managed Care, Other (non HMO) | Admitting: Family Medicine

## 2019-01-14 DIAGNOSIS — Z5181 Encounter for therapeutic drug level monitoring: Secondary | ICD-10-CM

## 2019-01-14 DIAGNOSIS — D509 Iron deficiency anemia, unspecified: Secondary | ICD-10-CM

## 2019-01-14 DIAGNOSIS — R634 Abnormal weight loss: Secondary | ICD-10-CM

## 2019-01-14 DIAGNOSIS — E785 Hyperlipidemia, unspecified: Secondary | ICD-10-CM

## 2019-01-14 DIAGNOSIS — J449 Chronic obstructive pulmonary disease, unspecified: Secondary | ICD-10-CM | POA: Diagnosis not present

## 2019-01-14 DIAGNOSIS — K2970 Gastritis, unspecified, without bleeding: Secondary | ICD-10-CM

## 2019-01-14 DIAGNOSIS — G40409 Other generalized epilepsy and epileptic syndromes, not intractable, without status epilepticus: Secondary | ICD-10-CM

## 2019-01-14 NOTE — Progress Notes (Signed)
Name: Nathan Mack   MRN: [id number]    DOB: 26-Aug-1955   Date:01/14/2019       Progress Note  Subjective:    Chief Complaint  Chief Complaint  Patient presents with  . Follow-up    I connected with  [name]  on 01/14/19 at  1:40 PM EDT by a video enabled telemedicine application and verified that I am speaking with the correct person using two identifiers.  I discussed the limitations of evaluation and management by telemedicine and the availability of in person appointments. The patient expressed understanding and agreed to proceed. Staff also discussed with the patient that there may be a patient responsible charge related to this service. Patient Location: home Provider Location: [facility] clinic Additional Individuals present: none  HPI   Seizure disorder - seeing Dr. Melrose Nakayama, he is taking pt off meds since no seizures for 30 + years  COPD -  Pt unaware of COPD diagnosis, states he gets bronchitis and congested when he gets sick, says its very rare only happens once every couple of years - current every day smoker, no desire to quit  Weight loss Went down to 139 now back up to 145, weight fluctuates, he is very active and working, he isn't worried about his weight.  He is eating better now, he is stopping to eat lunch and eating 3 square meals and adding in snacks.  He believe He says "they were concerned with weight loss for cancer"  Recently did LDCT scan which was negative and he will continue annual screenings, he also had colonoscopy completed and has family history of colon cancer  HLD-  Not on any statin, but cholesterol was high in the past is not working on any particular diet.  He does not recall ever being on statin medications in the past Lab Results  Component Value Date   CHOL 195 03/06/2017   HDL 60 03/06/2017   LDLCALC 109 (H) 03/06/2017   TRIG 138 03/06/2017   CHOLHDL 3.3 03/06/2017     Patient Active Problem List   Diagnosis Date Noted  . Loss of weight    . Polyp of ascending colon   . Duodenitis   . Dilantin toxicity 08/13/2018  . COPD (chronic obstructive pulmonary disease) ([place]) 02/06/2018  . Benign neoplasm of descending colon   . Benign neoplasm of transverse colon   . Benign neoplasm of ascending colon   . Gastritis without bleeding   . Other diseases of stomach and duodenum   . Need for vaccination 08/06/2017  . Family history of colon cancer in mother 08/06/2017  . Wellness examination 11/14/2015  . Tobacco abuse counseling 02/16/2015  . Encounter for annual health examination 11/10/2014  . Seizure disorder, grand mal ([place]) 11/10/2014  . Snoring 11/10/2014    Past Surgical History:  Procedure Laterality Date  . COLONOSCOPY WITH PROPOFOL N/A 09/09/2017   Procedure: COLONOSCOPY WITH PROPOFOL;  Surgeon: Lucilla Lame, MD;  Location: [facility] ENDOSCOPY;  Service: Endoscopy;  Laterality: N/A;  . COLONOSCOPY WITH PROPOFOL N/A 12/01/2018   Procedure: COLONOSCOPY WITH PROPOFOL;  Surgeon: Lucilla Lame, MD;  Location: [facility] ENDOSCOPY;  Service: Endoscopy;  Laterality: N/A;  . ESOPHAGOGASTRODUODENOSCOPY (EGD) WITH PROPOFOL N/A 09/09/2017   Procedure: ESOPHAGOGASTRODUODENOSCOPY (EGD) WITH PROPOFOL;  Surgeon: Lucilla Lame, MD;  Location: [facility] ENDOSCOPY;  Service: Endoscopy;  Laterality: N/A;  . ESOPHAGOGASTRODUODENOSCOPY (EGD) WITH PROPOFOL N/A 12/01/2018   Procedure: ESOPHAGOGASTRODUODENOSCOPY (EGD) WITH PROPOFOL;  Surgeon: Lucilla Lame, MD;  Location: [facility] ENDOSCOPY;  Service: Endoscopy;  Laterality: N/A;  . LUMBAR [place]    . TONSILLECTOMY      Family History  Problem Relation Age of Onset  . Colon cancer Mother     Social History   Socioeconomic History  . Marital status: Married    Spouse name: Maudie Mercury  . Number of children: 0  . Years of education: Not on file  . Highest education level: Not on file  Occupational History  . Not on file  Social Needs  . Financial resource strain: Not hard at all  . Food insecurity    Worry:  Never true    Inability: Never true  . Transportation needs    Medical: No    Non-medical: No  Tobacco Use  . Smoking status: Current Every Day Smoker    Packs/day: 1.00    Years: 45.00    Pack years: 45.00    Types: Cigarettes  . Smokeless tobacco: Never Used  Substance and Sexual Activity  . Alcohol use: No    Alcohol/week: 0.0 standard drinks  . Drug use: No  . Sexual activity: Yes    Partners: Female  Lifestyle  . Physical activity    Days per week: 5 days    Minutes per session: 30 min  . Stress: Not at all  Relationships  . Social Herbalist on phone: Three times a week    Gets together: Once a week    Attends religious service: Patient refused    Active member of club or organization: Patient refused    Attends meetings of clubs or organizations: Patient refused    Relationship status: Married  . Intimate partner violence    Fear of current or ex partner: No    Emotionally abused: No    Physically abused: No    Forced sexual activity: No  Other Topics Concern  . Not on file  Social History Narrative  . Not on file     Current Outpatient Medications:  .  calcium carbonate (CALCIUM 600) 1500 (600 Ca) MG TABS tablet, Take 1,500 mg by mouth daily., Disp: , Rfl:  .  Cholecalciferol (VITAMIN D-3) 125 MCG (5000 UT) TABS, Take 5,000 Units by mouth daily., Disp: , Rfl:  .  Ferrous Sulfate (IRON SUPPLEMENT PO), Take 1 tablet by mouth once a week. , Disp: , Rfl:  .  fluticasone (FLONASE) 50 MCG/ACT nasal spray, Place 2 sprays into both nostrils daily., Disp: 16 g, Rfl: 6 .  folic acid (FOLVITE) Q000111Q MCG tablet, Take 800 mcg by mouth daily. , Disp: , Rfl:  .  loratadine (CLARITIN) 10 MG tablet, TAKE 1 TABLET BY MOUTH EVERY DAY, Disp: 90 tablet, Rfl: 0 .  montelukast (SINGULAIR) 10 MG tablet, TAKE 1 TABLET BY MOUTH EVERYDAY AT BEDTIME, Disp: 90 tablet, Rfl: 1 .  phenytoin (DILANTIN) 100 MG ER capsule, Take 3 capsules (300 mg total) by mouth at bedtime., Disp: 360  capsule, Rfl: 1 .  vitamin B-12 (CYANOCOBALAMIN) 1000 MCG tablet, Take 1,000 mcg by mouth daily., Disp: , Rfl:   Allergies  Allergen Reactions  . Codeine   . Hydromorphone     I personally reviewed active problem list, medication list, allergies, family history, social history, health maintenance, lab results, endoscopy procedure notes, imaging with the patient/caregiver today.  Review of Systems  Constitutional: Negative.   HENT: Negative.   Eyes: Negative.   Respiratory: Negative.   Cardiovascular: Negative.   Gastrointestinal: Negative.   Endocrine: Negative.   Genitourinary: Negative.  Musculoskeletal: Negative.   Skin: Negative.   Allergic/Immunologic: Negative.   Neurological: Negative.   Hematological: Negative.   Psychiatric/Behavioral: Negative.   All other systems reviewed and are negative.     Objective:    Virtual encounter, vitals limited, only able to obtain the following There were no vitals filed for this visit. There is no height or weight on file to calculate BMI. Nursing Note and Vital Signs reviewed.  Physical Exam Vitals signs and nursing note reviewed.  Constitutional:      General: He is not in acute distress.    Appearance: Normal appearance. He is well-developed. He is not ill-appearing, toxic-appearing or diaphoretic.  HENT:     Head: Normocephalic and atraumatic.     Nose: Nose normal.  Eyes:     General: No scleral icterus.       Right eye: No discharge.        Left eye: No discharge.     Conjunctiva/sclera: Conjunctivae normal.  Neck:     Trachea: No tracheal deviation.  Cardiovascular:     Rate and Rhythm: Normal rate and regular rhythm.  Pulmonary:     Effort: Pulmonary effort is normal. No respiratory distress.     Breath sounds: No stridor.  Skin:    General: Skin is warm and dry.     Findings: No rash.  Neurological:     Mental Status: He is alert.     Motor: No abnormal muscle tone.  Psychiatric:        Behavior:  Behavior normal.     PE limited by telephone encounter  No results found for this or any previous visit (from the past 72 hour(s)).  PHQ2/9: Depression screen [facility] 2/9 01/14/2019 09/09/2018 08/28/2018 08/21/2018 08/18/2018  Decreased Interest 0 0 0 0 0  Down, Depressed, Hopeless 0 0 0 0 0  PHQ - 2 Score 0 0 0 0 0  Altered sleeping 0 0 0 0 0  Tired, decreased energy 0 0 0 0 0  Change in appetite 0 0 0 0 0  Feeling bad or failure about yourself  0 0 0 0 0  Trouble concentrating 0 0 0 0 0  Moving slowly or fidgety/restless 0 0 0 0 0  Suicidal thoughts 0 0 0 0 0  PHQ-9 Score 0 0 0 0 0  Difficult doing work/chores Not difficult at all Not difficult at all Not difficult at all - Not difficult at all   PHQ-2/9 Result is negative.    Fall Risk: Fall Risk  01/14/2019 09/09/2018 08/28/2018 08/21/2018 08/18/2018  Falls in the past year? 0 0 0 0 0  Number falls in past yr: 0 0 0 0 -  Injury with Fall? 0 0 0 0 -  Follow up Falls evaluation completed - - - -     Assessment and Plan:   1. Loss of weight His weight has improved he is working on his nutrition I will recheck basic labs  TSH has been normal in the past, lung cancer and colon cancer ruled out, patient believes is just because he works very hard and was not eating enough - CBC with Differential/Platelet; Future - Comprehensive Metabolic Panel (CMET); Future  2. Chronic obstructive pulmonary disease, unspecified COPD type ([place]) Patient denies any symptoms no chronic cough, wheeze, does get bronchitis occasionally he is not using inhalers daily has no shortness of breath, continues smoking  3. Gastritis without bleeding, unspecified chronicity, unspecified gastritis type Worked up recently by Dr. Allen Norris,  pt unclear on what his results were, what it means, and if he is supposed to do anything - I reviewed colonoscopy and pathology reports with the pt and did not see any f/up or med recommendations -polyps were benign he supposed to follow-up  in 5 years, he had gastritis and duodenitis and negative H. pylori he is not on any PPIs and he has no symptoms of acid reflux/GERD/gastritis We will reach out to Dr. Verl Blalock to see if he wanted him to be on anything - CBC with Differential/Platelet; Future  4. Seizure disorder, grand mal [facility]) Per neurology, Dr. Melrose Nakayama, weaning off medicines no seizure activity - Comprehensive Metabolic Panel (CMET); Future  5. Iron deficiency anemia, unspecified iron deficiency anemia type One weekly iron supplement, saw GI, recheck labs, no sx of worsening anemia - CBC with Differential/Platelet; Future  6. Medication monitoring encounter - Lipid panel; Future - CBC with Differential/Platelet; Future - Comprehensive Metabolic Panel (CMET); Future  7. Hyperlipidemia, unspecified hyperlipidemia type Patient says his cholesterol is fine, last labs done in 2018 where LDL was elevated will need new labs to assess risk, with smoking hx/age/sex suspect he will need statin for HLD and CVD tx/protection - Lipid panel; Future - Comprehensive Metabolic Panel (CMET); Future    I discussed the assessment and treatment plan with the patient. The patient was provided an opportunity to ask questions and all were answered. The patient agreed with the plan and demonstrated an understanding of the instructions.  The patient was advised to call back or seek an in-person evaluation if the symptoms worsen or if the condition fails to improve as anticipated.  I provided 20  minutes of non-face-to-face time during this encounter.  Delsa Grana, PA-C 09/24/202:00 PM

## 2019-01-19 NOTE — Progress Notes (Signed)
Hi, Sorry this got lost in a bunch of messages. A PPI would be great.

## 2019-01-20 ENCOUNTER — Other Ambulatory Visit: Payer: Self-pay | Admitting: Family Medicine

## 2019-01-20 DIAGNOSIS — K2980 Duodenitis without bleeding: Secondary | ICD-10-CM

## 2019-01-20 DIAGNOSIS — K2970 Gastritis, unspecified, without bleeding: Secondary | ICD-10-CM

## 2019-01-20 MED ORDER — PANTOPRAZOLE SODIUM 20 MG PO TBEC: 20.0000 mg | DELAYED_RELEASE_TABLET | ORAL | 5 refills | Status: DC

## 2019-01-20 NOTE — Progress Notes (Signed)
Could you please call patient and let him know that I reach out to Dr. Allen Norris about his recent testing and he did agree with a daily PPI.  I sent protonix 20 mg daily to his mail order pharmacy

## 2019-02-18 DIAGNOSIS — R569 Unspecified convulsions: Secondary | ICD-10-CM | POA: Insufficient documentation

## 2019-08-02 ENCOUNTER — Ambulatory Visit: Payer: Managed Care, Other (non HMO) | Attending: Internal Medicine

## 2019-08-02 DIAGNOSIS — Z23 Encounter for immunization: Secondary | ICD-10-CM

## 2019-08-02 NOTE — Progress Notes (Signed)
   Covid-19 Vaccination Clinic  Name:  Eileen Gagen    MRN: [id number] DOB: [birth year]  08/02/2019  Mr. Badenhop was observed post Covid-19 immunization for 15 minutes without incident. He was provided with Vaccine Information Sheet and instruction to access the V-Safe system.   Mr. Mooradian was instructed to call 911 with any severe reactions post vaccine: Marland Kitchen Difficulty breathing  . Swelling of face and throat  . A fast heartbeat  . A bad rash all over body  . Dizziness and weakness   Immunizations Administered    Name Date Dose VIS Date Route   Pfizer COVID-19 Vaccine 08/02/2019  1:03 PM 0.3 mL 04/02/2019 Intramuscular   Manufacturer: [place]   Lot: [id number]   [place]: [id number]

## 2019-08-24 ENCOUNTER — Ambulatory Visit: Payer: Managed Care, Other (non HMO) | Attending: Internal Medicine

## 2019-08-24 DIAGNOSIS — Z23 Encounter for immunization: Secondary | ICD-10-CM

## 2019-08-24 NOTE — Progress Notes (Signed)
   Covid-19 Vaccination Clinic  Name:  Nathan Mack    MRN: [id number] DOB: 1956/01/05  08/24/2019  Mr. Nathan Mack was observed post Covid-19 immunization for 15 minutes without incident. He was provided with Vaccine Information Sheet and instruction to access the V-Safe system.   Mr. Nathan Mack was instructed to call 911 with any severe reactions post vaccine: Marland Kitchen Difficulty breathing  . Swelling of face and throat  . A fast heartbeat  . A bad rash all over body  . Dizziness and weakness   Immunizations Administered    Name Date Dose VIS Date Route   Pfizer COVID-19 Vaccine 08/24/2019  9:47 AM 0.3 mL 06/16/2018 Intramuscular   Manufacturer: [place]   Lot: [id number]   [place]: [id number]

## 2019-09-08 IMAGING — MR MRI HEAD WITHOUT CONTRAST
9 of 14 series · 26 of 48 positions shown · non-contrast
Comparison: None.

CLINICAL DATA: History of seizure

EXAM:
MRI HEAD WITHOUT CONTRAST
TECHNIQUE: Multiplanar, multiecho pulse sequences of the brain and surrounding
structures were obtained without intravenous contrast.

[Series 5: T1 · sagittal · 5.0mm · 0.62mm/px · 1 of 25 slices shown (1 of 3)]
[im 1/25]
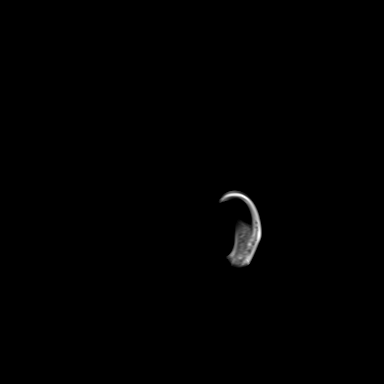

[Series 6: ax dwi_tracew · axial · 3.0mm · 0.73mm/px · z∈[-57,+51]mm · 3 of 55 slices shown]
[im 1/55]
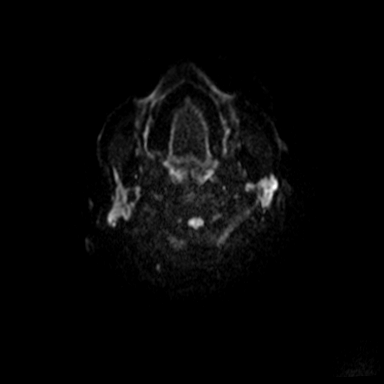
[im 19/55]
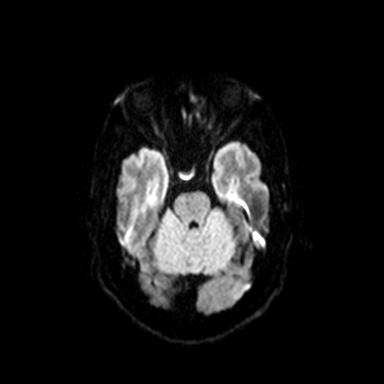
[im 37/55]
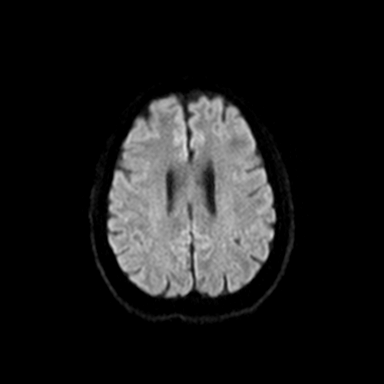

[Series 10: T2 · axial · 5.0mm · 0.53mm/px · z∈[-54,+101]mm · 2 of 27 slices shown (1 of 3)]
[im 1/27]
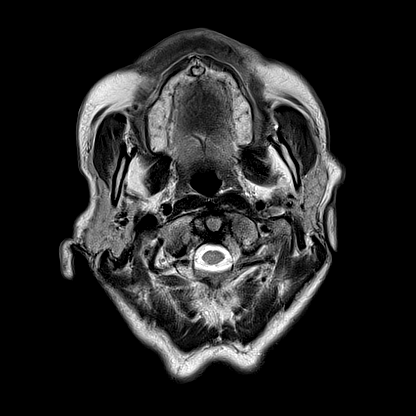
[im 27/27]
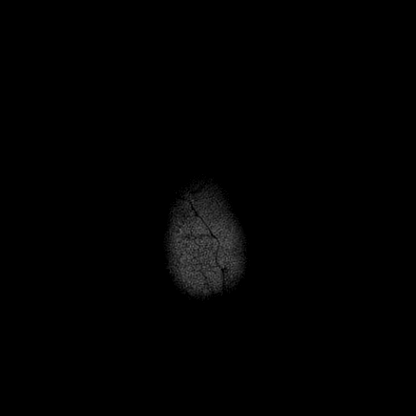

[Series 15: FLAIR · axial · 3.0mm · 0.53mm/px · z∈[-61,+100]mm · 4 of 55 slices shown (1 of 2)]
[im 1/55]
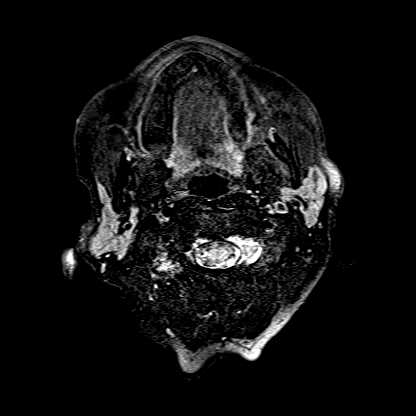
[im 19/55]
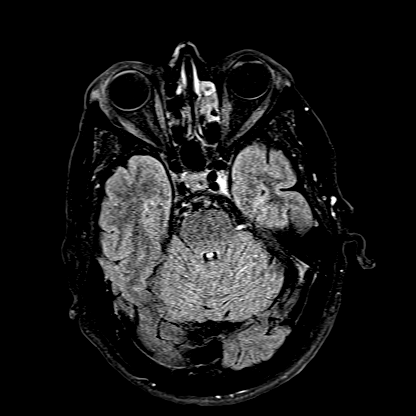
[im 37/55]
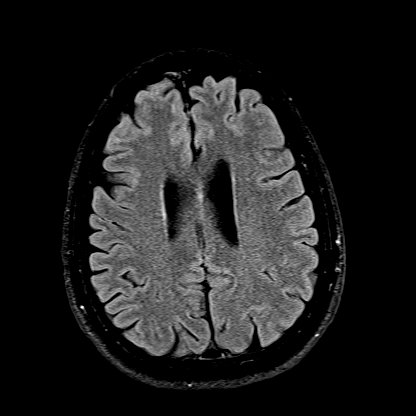
[im 55/55]
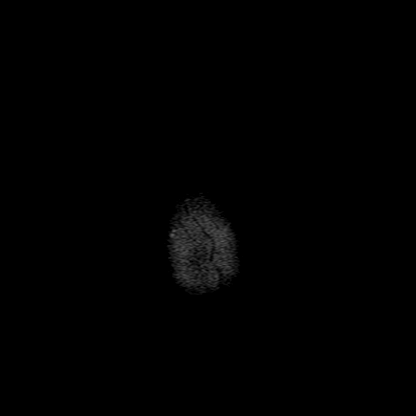

[Series 16: T1 · axial · 1.0mm · 0.98mm/px · z∈[-67,+107]mm · 8 of 176 slices shown (2 of 3)]
[im 1/176]
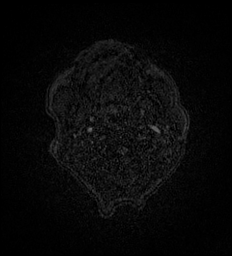
[im 32/176]
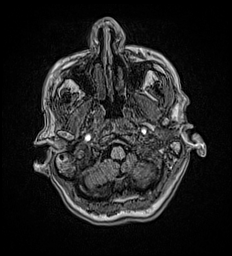
[im 48/176]
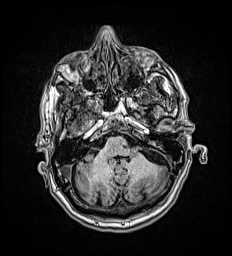
[im 80/176]
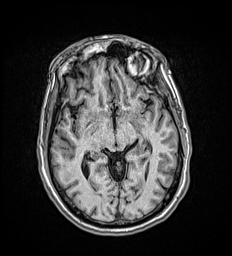
[im 96/176]
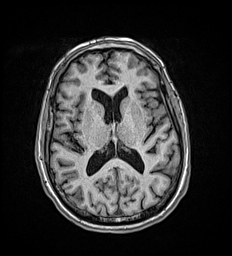
[im 128/176]
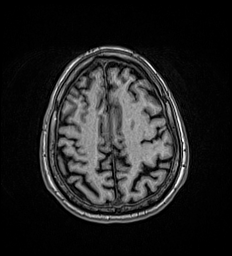
[im 144/176]
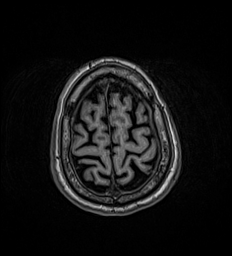
[im 176/176]
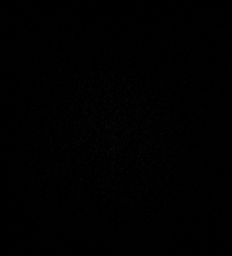

[Series 17: T2 · coronal · 3.0mm · 0.23mm/px · 2 of 35 slices shown (2 of 3)]
[im 1/35]
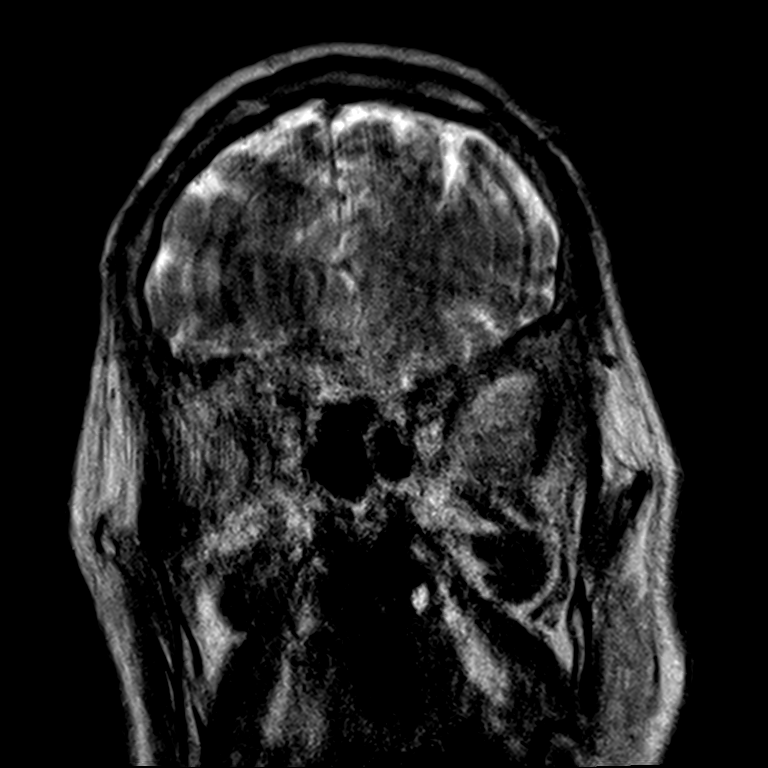
[im 35/35]
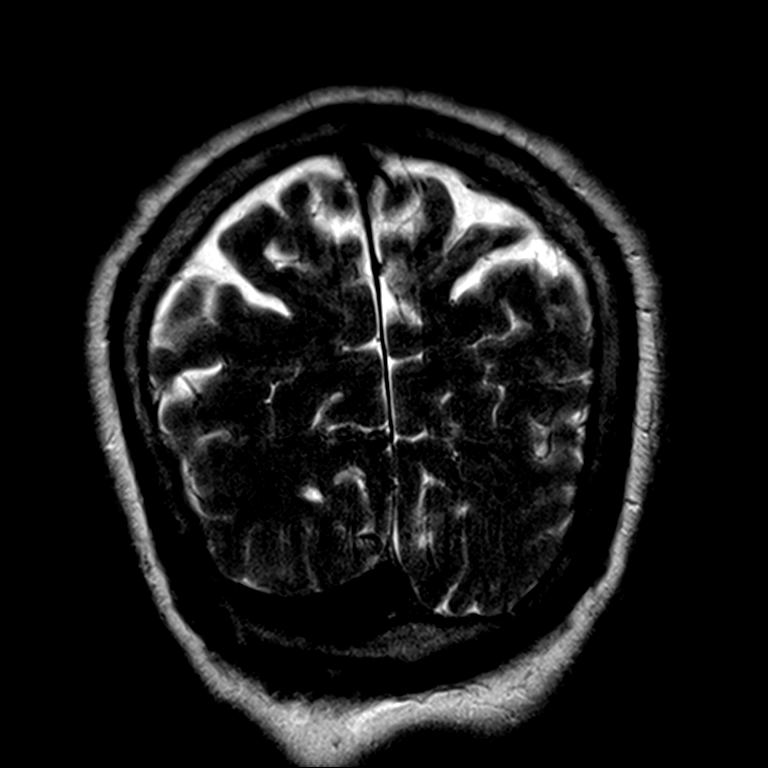

[Series 18: T2 · coronal · 3.0mm · 0.23mm/px · 2 of 35 slices shown (3 of 3)]
[im 1/35]
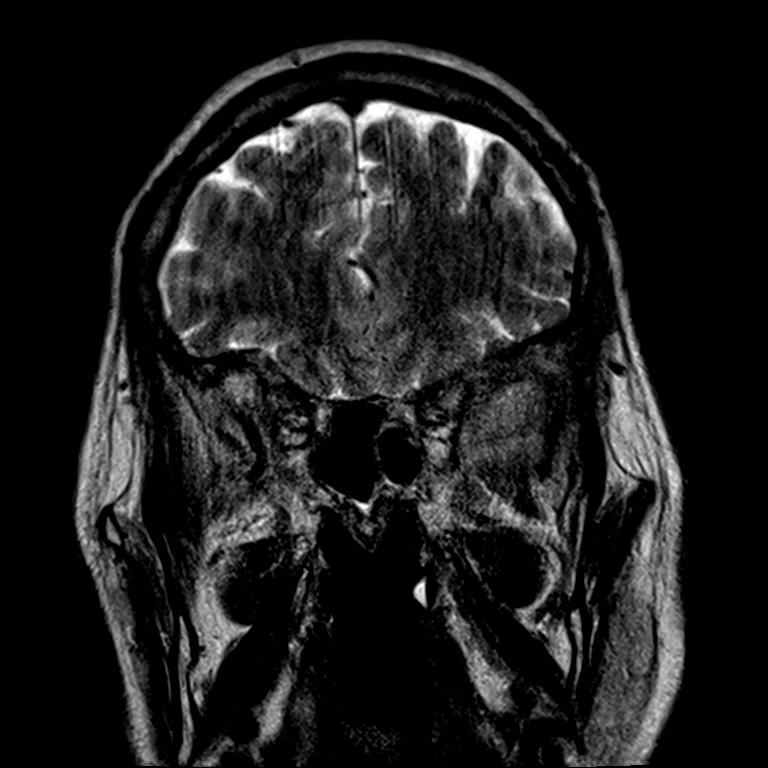
[im 35/35]
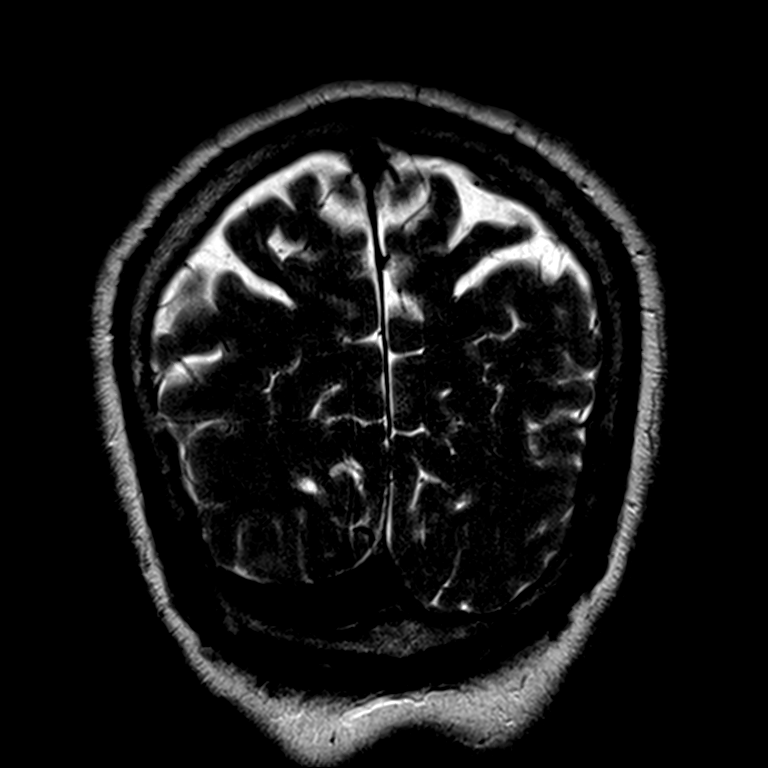

[Series 19: FLAIR · coronal · 3.0mm · 0.35mm/px · 2 of 35 slices shown (2 of 2)]
[im 1/35]
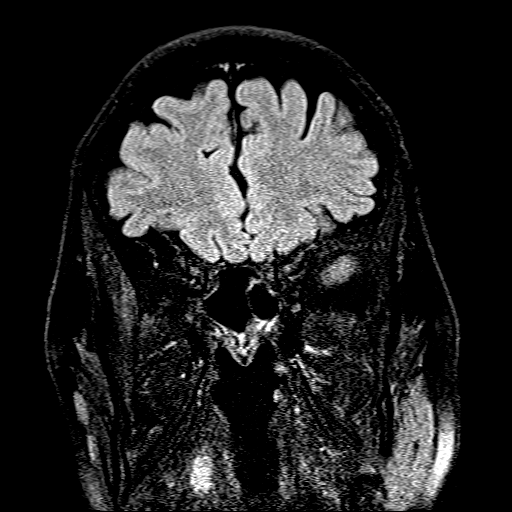
[im 35/35]
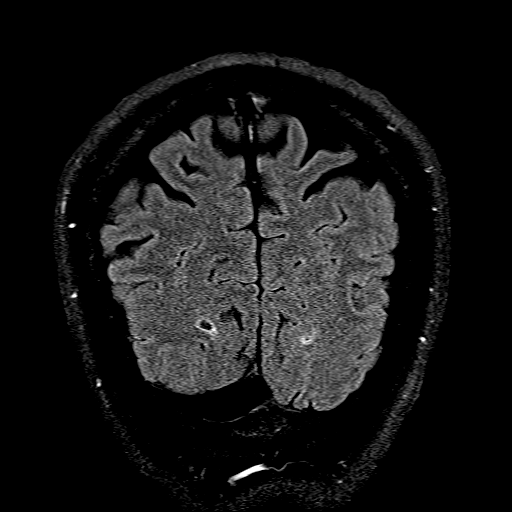

[Series 20: T1 · axial · 5.0mm · 0.90mm/px · z∈[-59,+96]mm · 2 of 27 slices shown (3 of 3)]
[im 1/27]
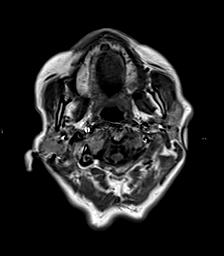
[im 27/27]
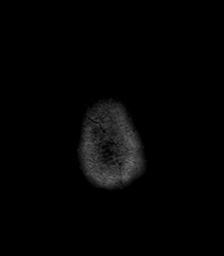

[26 of 48 positions shown; findings below may reference images not displayed]

FINDINGS: Brain: 9 mm rounded lesion in the left caudate head with a somewhat
"popcorn" appearance and susceptibility artifact-findings most
consistent with a cavernoma. No cortical abnormality is seen. There
is symmetric normal signal in the hippocampi.

Age normal brain volume and white matter appearance. No infarct,
hydrocephalus, collection, or shift.

Vascular: Normal flow voids

Skull and upper cervical spine: Negative for marrow lesion

Sinuses/Orbits: Mucosal thickening in the paranasal sinuses,
especially the bilateral ethmoids and left maxillary.
IMPRESSION: 1. Findings of cavernoma in the left caudate head measuring 9 mm. No
evidence of recent hemorrhage.
2. No visible cortical or hippocampal abnormality.

## 2019-09-15 ENCOUNTER — Telehealth: Payer: Self-pay

## 2019-09-15 NOTE — Telephone Encounter (Signed)
Message left notifying patient that it is time to schedule the low dose lung cancer screening CT scan.  Instructed patient to return call to Shawn Perkins at [phone] to verify information prior to CT scan being scheduled.    

## 2019-10-08 ENCOUNTER — Telehealth: Payer: Self-pay | Admitting: *Deleted

## 2019-10-08 NOTE — Telephone Encounter (Signed)
(  10/08/2019) Left message for pt to notify them that it is time to schedule annual low dose lung cancer screening CT scan. Instructed patient to call back to verify information prior to the scan being scheduled SRW

## 2019-10-31 ENCOUNTER — Telehealth: Payer: Self-pay

## 2019-10-31 NOTE — Telephone Encounter (Signed)
Called patient at [phone] to schedule annual lung screening CT (patient's last scan was June 2020).  Patient's wife answered the call and said they were out of town in [place] visiting family right now, but would call when they return to schedule the appointment.  I have patient's wife number to Burgess Estelle, lung navigator, to call when they return to town.

## 2019-12-17 ENCOUNTER — Telehealth: Payer: Self-pay | Admitting: *Deleted

## 2019-12-17 NOTE — Telephone Encounter (Signed)
I called patient and spoke to Maudie Mercury his wife. She said he doesn't want to be a part of this program anymore.

## 2022-02-25 ENCOUNTER — Ambulatory Visit
Admission: EM | Admit: 2022-02-25 | Discharge: 2022-02-25 | Disposition: A | Discharge status: Home / Self Care | Payer: Medicare Other

## 2022-02-25 DIAGNOSIS — S76212A Strain of adductor muscle, fascia and tendon of left thigh, initial encounter: Secondary | ICD-10-CM

## 2022-02-25 NOTE — Discharge Instructions (Signed)
Follow up here or with your primary care provider if your symptoms are worsening or not improving.     

## 2022-02-25 NOTE — ED Provider Notes (Addendum)
Nathan Mack    CSN: [id number] Arrival date & time: 02/25/22  1512      History   Chief Complaint Chief Complaint  Patient presents with   Muscle Pain    HPI Nathan Mack is a 66 y.o. male.    Muscle Pain    Presents to UC with complaint of pain in his left thigh and patient states it feels like a pulled muscle and expresses concern for hernia.  Endorses hernia on the opposite side.  Pain started 3 days ago following an episode 5 days ago where he lifted a heavy bottle of water.  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   COPD (chronic obstructive pulmonary disease) ([place]) 02/06/2018   Benign neoplasm of descending colon    Benign neoplasm of transverse colon    Benign neoplasm of ascending colon    Gastritis without bleeding    Other diseases of stomach and duodenum    Need for vaccination 08/06/2017   Family history of colon cancer in mother 08/06/2017   Wellness examination 11/14/2015   Tobacco abuse counseling 02/16/2015   Encounter for annual health examination 11/10/2014   Seizure disorder, grand mal ([place]) 11/10/2014   Snoring 11/10/2014    Past Surgical History:  Procedure Laterality Date   COLONOSCOPY WITH PROPOFOL N/A 09/09/2017   Procedure: COLONOSCOPY WITH PROPOFOL;  Surgeon: Lucilla Lame, MD;  Location: [facility] ENDOSCOPY;  Service: Endoscopy;  Laterality: N/A;   COLONOSCOPY WITH PROPOFOL N/A 12/01/2018   Procedure: COLONOSCOPY WITH PROPOFOL;  Surgeon: Lucilla Lame, MD;  Location: [facility] ENDOSCOPY;  Service: Endoscopy;  Laterality: N/A;   ESOPHAGOGASTRODUODENOSCOPY (EGD) WITH PROPOFOL N/A 09/09/2017   Procedure:  ESOPHAGOGASTRODUODENOSCOPY (EGD) WITH PROPOFOL;  Surgeon: Lucilla Lame, MD;  Location: [facility] ENDOSCOPY;  Service: Endoscopy;  Laterality: N/A;   ESOPHAGOGASTRODUODENOSCOPY (EGD) WITH PROPOFOL N/A 12/01/2018   Procedure: ESOPHAGOGASTRODUODENOSCOPY (EGD) WITH PROPOFOL;  Surgeon: Lucilla Lame, MD;  Location: [facility] ENDOSCOPY;  Service: Endoscopy;  Laterality: N/A;   LUMBAR DISC SURGERY     TONSILLECTOMY         Home Medications    Prior to Admission medications   Medication Sig Start Date End Date Taking? Authorizing Provider  albuterol (VENTOLIN HFA) 108 (90 Base) MCG/ACT inhaler  07/12/21  Yes [provider]  levETIRAcetam (KEPPRA) 500 MG tablet Take by mouth. 10/24/19  Yes [provider]  calcium carbonate (CALCIUM 600) 1500 (600 Ca) MG TABS tablet Take 1,500 mg by mouth daily.    [provider]  Cholecalciferol (VITAMIN D-3) 125 MCG (5000 UT) TABS Take 5,000 Units by mouth daily.    [provider]  Ferrous Sulfate (IRON SUPPLEMENT PO) Take 1 tablet by mouth once a week.     [provider]  fluticasone (FLONASE) 50 MCG/ACT nasal spray Place 2 sprays into both nostrils daily. 08/28/18   Poulose, Bethel Born, NP  folic acid (FOLVITE) 865 MCG tablet Take 800 mcg by mouth daily.     [provider]  loratadine (CLARITIN) 10 MG tablet TAKE 1 TABLET BY MOUTH EVERY DAY 11/12/18   Poulose, Bethel Born, NP  montelukast (SINGULAIR) 10 MG  tablet TAKE 1 TABLET BY MOUTH EVERYDAY AT BEDTIME 09/24/18   Poulose, Bethel Born, NP  pantoprazole (PROTONIX) 20 MG tablet Take 1 tablet (20 mg total) by mouth every morning. One hour before breakfast 01/20/19   Delsa Grana, PA-C  phenytoin (DILANTIN) 100 MG ER capsule Take 3 capsules (300 mg total) by mouth at bedtime. 09/09/18   Poulose, Bethel Born, NP  vitamin B-12 (CYANOCOBALAMIN) 1000 MCG tablet Take 1,000 mcg by mouth daily.    [provider]    Family History Family History  Problem Relation Age of  Onset   Colon cancer Mother     Social History Social History   Tobacco Use   Smoking status: Every Day    Packs/day: 1.00    Years: 45.00    Total pack years: 45.00    Types: Cigarettes   Smokeless tobacco: Never  Vaping Use   Vaping Use: Never used  Substance Use Topics   Alcohol use: No    Alcohol/week: 0.0 standard drinks of alcohol   Drug use: No     Allergies   Codeine, Hydromorphone, and Morphine   Review of Systems Review of Systems   Physical Exam Triage Vital Signs ED Triage Vitals [02/25/22 1526]  Enc Vitals Group     BP 129/82     Pulse Rate 67     Resp 18     Temp 98.4 F (36.9 C)     Temp src      SpO2 98 %     Weight      Height      Head Circumference      Peak Flow      Pain Score 5     Pain Loc      Pain Edu?      Excl. in [place]?    No data found.  Updated Vital Signs BP 129/82   Pulse 67   Temp 98.4 F (36.9 C)   Resp 18   SpO2 98%   Visual Acuity Right Eye Distance:   Left Eye Distance:   Bilateral Distance:    Right Eye Near:   Left Eye Near:    Bilateral Near:     Physical Exam Vitals reviewed. Exam conducted with a chaperone present.  Constitutional:      Appearance: Normal appearance.  Cardiovascular:     Rate and Rhythm: Normal rate and regular rhythm.  Abdominal:     Hernia: There is no hernia in the left inguinal area.  Musculoskeletal:       Legs:  Skin:    General: Skin is warm and dry.  Neurological:     General: No focal deficit present.     Mental Status: He is alert and oriented to person, place, and time.  Psychiatric:        Mood and Affect: Mood normal.        Behavior: Behavior normal.      UC Treatments / Results  Labs (all labs ordered are listed, but only abnormal results are displayed) Labs Reviewed - No data to display  EKG   Radiology No results found.  Procedures Procedures (including critical care time)  Medications Ordered in UC Medications - No data to  display  Initial Impression / Assessment and Plan / UC Course  I have reviewed the triage vital signs and the nursing notes.  Pertinent labs & imaging results that were available during my care of the patient were reviewed by me and considered in  my medical decision making (see chart for details).   Muscle strain of left upper leg, psoas major versus adductive longus.  Recommended heat therapy with gentle stretching.   Final Clinical Impressions(s) / UC Diagnoses   Final diagnoses:  None   Discharge Instructions   None    ED Prescriptions   None    PDMP not reviewed this encounter.   Rose Phi, [place] 02/25/22 [place], [place], [place] 02/25/22 1554

## 2022-02-25 NOTE — ED Triage Notes (Signed)
Pt. Presents to UC w/ c/o pain in his left thigh. Pt. States it feels like a pulled a muscle and expresses concern for a hernia. Pt. States he has hx of past hernia on the opposite thigh

## 2022-12-13 ENCOUNTER — Telehealth: Payer: Medicare HMO | Admitting: Physician Assistant

## 2022-12-13 DIAGNOSIS — U071 COVID-19: Secondary | ICD-10-CM

## 2022-12-13 MED ORDER — MOLNUPIRAVIR EUA 200MG CAPSULE: 4.0000 | ORAL_CAPSULE | Freq: Two times a day (BID) | ORAL | 0 refills | Status: AC

## 2022-12-13 MED ORDER — BENZONATATE 100 MG PO CAPS: 100.0000 mg | ORAL_CAPSULE | Freq: Three times a day (TID) | ORAL | 0 refills | Status: DC | PRN

## 2022-12-13 NOTE — Progress Notes (Signed)
Virtual Visit Consent   Nathan Mack, you are scheduled for a virtual visit with a [facility] provider today. Just as with appointments in the office, your consent must be obtained to participate. Your consent will be active for this visit and any virtual visit you may have with one of our providers in the next 365 days. If you have a MyChart account, a copy of this consent can be sent to you electronically.  As this is a virtual visit, video technology does not allow for your provider to perform a traditional examination. This may limit your provider's ability to fully assess your condition. If your provider identifies any concerns that need to be evaluated in person or the need to arrange testing (such as labs, EKG, etc.), we will make arrangements to do so. Although advances in technology are sophisticated, we cannot ensure that it will always work on either your end or our end. If the connection with a video visit is poor, the visit may have to be switched to a telephone visit. With either a video or telephone visit, we are not always able to ensure that we have a secure connection.  By engaging in this virtual visit, you consent to the provision of healthcare and authorize for your insurance to be billed (if applicable) for the services provided during this visit. Depending on your insurance coverage, you may receive a charge related to this service.  I need to obtain your verbal consent now. Are you willing to proceed with your visit today? Jammes Rizer has provided verbal consent on 12/13/2022 for a virtual visit (video or telephone). [name], PA-C  Date: 12/13/2022 7:33 PM  Virtual Visit via Video Note   I, [name], connected with  Nathan Mack  ([id number], 17-Apr-1956) on 12/13/22 at  7:30 PM EDT by a video-enabled telemedicine application and verified that I am speaking with the correct person using two identifiers.  Location: Patient: Virtual Visit Location Patient:  Home Provider: Virtual Visit Location Provider: Home Office   I discussed the limitations of evaluation and management by telemedicine and the availability of in person appointments. The patient expressed understanding and agreed to proceed.    History of Present Illness: Nathan Mack is a 67 y.o. who identifies as a male who was assigned male at birth, and is being seen today for Covid 19.  HPI: URI  This is a new problem. The current episode started yesterday (Tested positive for Covid 19 on at home test; Symptoms started last night). The problem has been gradually worsening. Maximum temperature: subjective fever on first night. Associated symptoms include congestion, coughing, headaches, a plugged ear sensation, rhinorrhea, sinus pain, sneezing and wheezing. Pertinent negatives include no chest pain, diarrhea, ear pain, nausea, sore throat or vomiting. Associated symptoms comments: Chills, myalgias, fatigue. He has tried nothing for the symptoms. The treatment provided no relief.     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   COPD (chronic obstructive pulmonary disease) ([facility]) 02/06/2018   Benign neoplasm of descending colon    Benign neoplasm of transverse colon    Benign neoplasm of ascending colon    Gastritis without bleeding    Other diseases of stomach and duodenum    Need for  vaccination 08/06/2017   Family history of colon cancer in mother 08/06/2017   Wellness examination 11/14/2015   Tobacco abuse counseling 02/16/2015   Encounter for annual health examination 11/10/2014   Seizure disorder, grand mal ([facility]) 11/10/2014   Snoring 11/10/2014    Allergies:  Allergies  Allergen Reactions   Codeine    Hydromorphone    Morphine    Medications:  Current  Outpatient Medications:    benzonatate (TESSALON) 100 MG capsule, Take 1 capsule (100 mg total) by mouth 3 (three) times daily as needed., Disp: 30 capsule, Rfl: 0   molnupiravir EUA (LAGEVRIO) 200 mg CAPS capsule, Take 4 capsules (800 mg total) by mouth 2 (two) times daily for 5 days., Disp: 40 capsule, Rfl: 0   albuterol (VENTOLIN HFA) 108 (90 Base) MCG/ACT inhaler, , Disp: , Rfl:    calcium carbonate (CALCIUM 600) 1500 (600 Ca) MG TABS tablet, Take 1,500 mg by mouth daily., Disp: , Rfl:    Cholecalciferol (VITAMIN D-3) 125 MCG (5000 UT) TABS, Take 5,000 Units by mouth daily., Disp: , Rfl:    Ferrous Sulfate (IRON SUPPLEMENT PO), Take 1 tablet by mouth once a week. , Disp: , Rfl:    fluticasone (FLONASE) 50 MCG/ACT nasal spray, Place 2 sprays into both nostrils daily., Disp: 16 g, Rfl: 6   folic acid (FOLVITE) 800 MCG tablet, Take 800 mcg by mouth daily. , Disp: , Rfl:    levETIRAcetam (KEPPRA) 500 MG tablet, Take by mouth., Disp: , Rfl:    loratadine (CLARITIN) 10 MG tablet, TAKE 1 TABLET BY MOUTH EVERY DAY, Disp: 90 tablet, Rfl: 0   montelukast (SINGULAIR) 10 MG tablet, TAKE 1 TABLET BY MOUTH EVERYDAY AT BEDTIME, Disp: 90 tablet, Rfl: 1   pantoprazole (PROTONIX) 20 MG tablet, Take 1 tablet (20 mg total) by mouth every morning. One hour before breakfast, Disp: 30 tablet, Rfl: 5   phenytoin (DILANTIN) 100 MG ER capsule, Take 3 capsules (300 mg total) by mouth at bedtime., Disp: 360 capsule, Rfl: 1   vitamin B-12 (CYANOCOBALAMIN) 1000 MCG tablet, Take 1,000 mcg by mouth daily., Disp: , Rfl:   Observations/Objective: Patient is well-developed, well-nourished in no acute distress.  Resting comfortably at home.  Head is normocephalic, atraumatic.  No labored breathing.  Speech is clear and coherent with logical content.  Patient is alert and oriented at baseline.    Assessment and Plan: 1. COVID-19 - benzonatate (TESSALON) 100 MG capsule; Take 1 capsule (100 mg total) by mouth 3 (three)  times daily as needed.  Dispense: 30 capsule; Refill: 0 - molnupiravir EUA (LAGEVRIO) 200 mg CAPS capsule; Take 4 capsules (800 mg total) by mouth 2 (two) times daily for 5 days.  Dispense: 40 capsule; Refill: 0 - MyChart COVID-19 home monitoring program; Future  - Continue OTC symptomatic management of choice - Will send OTC vitamins and supplement information through AVS - Molnupiravir prescribed; Patient on Dilantin and cannot take Paxlovid, also no recent labs - Tessalon for cough - Patient enrolled in MyChart symptom monitoring - Push fluids - Rest as needed - Discussed return precautions and when to seek in-person evaluation, sent via AVS as well   Follow Up Instructions: I discussed the assessment and treatment plan with the patient. The patient was provided an opportunity to ask questions and all were answered. The patient agreed with the plan and demonstrated an understanding of the instructions.  A copy of instructions were sent to the patient via MyChart unless otherwise noted below.  The patient was advised to call back or seek an in-person evaluation if the symptoms worsen or if the condition fails to improve as anticipated.  Time:  I spent 15 minutes with the patient via telehealth technology discussing the above problems/concerns.    [name], PA-C

## 2022-12-13 NOTE — Patient Instructions (Signed)
Nathan Mack, thank you for joining [name], PA-C for today's virtual visit.  While this provider is not your primary care provider (PCP), if your PCP is located in our provider database this encounter information will be shared with them immediately following your visit.   A [place] MyChart account gives you access to today's visit and all your visits, tests, and labs performed at [facility] " click here if you don't have a [place] MyChart account or go to [email or web address]  Consent: (Patient) Nathan Mack provided verbal consent for this virtual visit at the beginning of the encounter.  Current Medications:  Current Outpatient Medications:    benzonatate (TESSALON) 100 MG capsule, Take 1 capsule (100 mg total) by mouth 3 (three) times daily as needed., Disp: 30 capsule, Rfl: 0   molnupiravir EUA (LAGEVRIO) 200 mg CAPS capsule, Take 4 capsules (800 mg total) by mouth 2 (two) times daily for 5 days., Disp: 40 capsule, Rfl: 0   albuterol (VENTOLIN HFA) 108 (90 Base) MCG/ACT inhaler, , Disp: , Rfl:    calcium carbonate (CALCIUM 600) 1500 (600 Ca) MG TABS tablet, Take 1,500 mg by mouth daily., Disp: , Rfl:    Cholecalciferol (VITAMIN D-3) 125 MCG (5000 UT) TABS, Take 5,000 Units by mouth daily., Disp: , Rfl:    Ferrous Sulfate (IRON SUPPLEMENT PO), Take 1 tablet by mouth once a week. , Disp: , Rfl:    fluticasone (FLONASE) 50 MCG/ACT nasal spray, Place 2 sprays into both nostrils daily., Disp: 16 g, Rfl: 6   folic acid (FOLVITE) 800 MCG tablet, Take 800 mcg by mouth daily. , Disp: , Rfl:    levETIRAcetam (KEPPRA) 500 MG tablet, Take by mouth., Disp: , Rfl:    loratadine (CLARITIN) 10 MG tablet, TAKE 1 TABLET BY MOUTH EVERY DAY, Disp: 90 tablet, Rfl: 0   montelukast (SINGULAIR) 10 MG tablet, TAKE 1 TABLET BY MOUTH EVERYDAY AT BEDTIME, Disp: 90 tablet, Rfl: 1   pantoprazole (PROTONIX) 20 MG tablet, Take 1 tablet (20 mg total) by mouth every morning. One hour  before breakfast, Disp: 30 tablet, Rfl: 5   phenytoin (DILANTIN) 100 MG ER capsule, Take 3 capsules (300 mg total) by mouth at bedtime., Disp: 360 capsule, Rfl: 1   vitamin B-12 (CYANOCOBALAMIN) 1000 MCG tablet, Take 1,000 mcg by mouth daily., Disp: , Rfl:    Medications ordered in this encounter:  Meds ordered this encounter  Medications   benzonatate (TESSALON) 100 MG capsule    Sig: Take 1 capsule (100 mg total) by mouth 3 (three) times daily as needed.    Dispense:  30 capsule    Refill:  0    Order Specific Question:   Supervising Provider    Answer:   Merrilee Jansky [[id number]]   molnupiravir EUA (LAGEVRIO) 200 mg CAPS capsule    Sig: Take 4 capsules (800 mg total) by mouth 2 (two) times daily for 5 days.    Dispense:  40 capsule    Refill:  0    Patient is on Dilantin, cannot take Paxlovid    Order Specific Question:   Supervising Provider    Answer:   Merrilee Jansky [[id number]]     *If you need refills on other medications prior to your next appointment, please contact your pharmacy*  Follow-Up: Call back or seek an in-person evaluation if the symptoms worsen or if the condition fails to improve as anticipated.  [facility] Virtual Care [phone]  Care Instructions:  Molnupiravir Capsules What is this medication? MOLNUPIRAVIR (MOL nue PIR a vir) treats mild to moderate COVID-19. It may help people who are at high risk of developing severe illness. This medication works by limiting the spread of the virus in your body. The FDA has allowed the emergency use of this medication. This medicine may be used for other purposes; ask your health care provider or pharmacist if you have questions. COMMON BRAND NAME(S): LAGEVRIO What should I tell my care team before I take this medication? They need to know if you have any of these conditions: Any allergies Any serious illness An unusual or allergic reaction to molnupiravir, other medications, foods, dyes, or  preservatives Pregnant or trying to get pregnant Breast-feeding How should I use this medication? Take this medication by mouth with water. Take it as directed on the prescription label at the same time every day. Do not cut, crush, or chew this medication. Swallow the capsules whole. You can take it with or without food. If it upsets your stomach, take it with food. Take all of it unless your care team tells you to stop it early. Keep taking it even if you think you are better. Talk to your care team about the use of this medication in children. Special care may be needed. Overdosage: If you think you have taken too much of this medicine contact a poison control center or emergency room at once. NOTE: This medicine is only for you. Do not share this medicine with others. What if I miss a dose? If you miss a dose, take it as soon as you can unless it is more than 10 hours late. If it is more than 10 hours late, skip the missed dose. Take the next dose at the normal time. Do not take extra or 2 doses at the same time to make up for the missed dose. What may interact with this medication? Interactions have not been studied. This list may not describe all possible interactions. Give your health care provider a list of all the medicines, herbs, non-prescription drugs, or dietary supplements you use. Also tell them if you smoke, drink alcohol, or use illegal drugs. Some items may interact with your medicine. What should I watch for while using this medication? Your condition will be monitored carefully while you are receiving this medication. Visit your care team for regular checkups. Tell your care team if your symptoms do not start to get better or if they get worse. Do not become pregnant while taking this medication. You may need a pregnancy test before starting this medication. Women must use a reliable form of birth control while taking this medication and for 4 days after stopping the medication.  Women should inform their care team if they wish to become pregnant or think they might be pregnant. Men should not father a child while taking this medication and for 3 months after stopping it. There is potential for serious harm to an unborn child. Talk to your care team for more information. Do not breast-feed an infant while taking this medication and for 4 days after stopping the medication. What side effects may I notice from receiving this medication? Side effects that you should report to your care team as soon as possible: Allergic reactions--skin rash, itching, hives, swelling of the face, lips, tongue, or throat Side effects that usually do not require medical attention (report these to your care team if they continue or are bothersome): Diarrhea Dizziness Nausea This list may  not describe all possible side effects. Call your doctor for medical advice about side effects. You may report side effects to FDA at 1-800-FDA-1088. Where should I keep my medication? Keep out of the reach of children and pets. Store at room temperature between 20 and 25 degrees C (68 and 77 degrees F). Get rid of any unused medication after the expiration date. To get rid of medications that are no longer needed or have expired: Take the medication to a medication take-back program. Check with your pharmacy or law enforcement to find a location. If you cannot return the medication, check the label or package insert to see if the medication should be thrown out in the garbage or flushed down the toilet. If you are not sure, ask your care team. If it is safe to put it in the trash, take the medication out of the container. Mix the medication with cat litter, dirt, coffee grounds, or other unwanted substance. Seal the mixture in a bag or container. Put it in the trash. NOTE: This sheet is a summary. It may not cover all possible information. If you have questions about this medicine, talk to your doctor, pharmacist,  or health care provider.  2024 Elsevier/Gold Standard (2021-06-04 00:00:00)    Isolation Instructions: You are to isolate at home until you have been fever free for at least 24 hours without a fever-reducing medication, and symptoms have been steadily improving for 24 hours. At that time,  you can end isolation but need to mask for an additional 5 days.   If you must be around other household members who do not have symptoms, you need to make sure that both you and the family members are masking consistently with a high-quality mask.  If you note any worsening of symptoms despite treatment, please seek an in-person evaluation ASAP. If you note any significant shortness of breath or any chest pain, please seek ER evaluation. Please do not delay care!   COVID-19: What to Do if You Are Sick If you test positive and are an older adult or someone who is at high risk of getting very sick from COVID-19, treatment may be available. Contact a healthcare provider right away after a positive test to determine if you are eligible, even if your symptoms are mild right now. You can also visit a Test to Treat location and, if eligible, receive a prescription from a provider. Don't delay: Treatment must be started within the first few days to be effective. If you have a fever, cough, or other symptoms, you might have COVID-19. Most people have mild illness and are able to recover at home. If you are sick: Keep track of your symptoms. If you have an emergency warning sign (including trouble breathing), call 911. Steps to help prevent the spread of COVID-19 if you are sick If you are sick with COVID-19 or think you might have COVID-19, follow the steps below to care for yourself and to help protect other people in your home and community. Stay home except to get medical care Stay home. Most people with COVID-19 have mild illness and can recover at home without medical care. Do not leave your home, except to get  medical care. Do not visit public areas and do not go to places where you are unable to wear a mask. Take care of yourself. Get rest and stay hydrated. Take over-the-counter medicines, such as acetaminophen, to help you feel better. Stay in touch with your doctor. Call before you  get medical care. Be sure to get care if you have trouble breathing, or have any other emergency warning signs, or if you think it is an emergency. Avoid public transportation, ride-sharing, or taxis if possible. Get tested If you have symptoms of COVID-19, get tested. While waiting for test results, stay away from others, including staying apart from those living in your household. Get tested as soon as possible after your symptoms start. Treatments may be available for people with COVID-19 who are at risk for becoming very sick. Don't delay: Treatment must be started early to be effective--some treatments must begin within 5 days of your first symptoms. Contact your healthcare provider right away if your test result is positive to determine if you are eligible. Self-tests are one of several options for testing for the virus that causes COVID-19 and may be more convenient than laboratory-based tests and point-of-care tests. Ask your healthcare provider or your local health department if you need help interpreting your test results. You can visit your state, tribal, local, and territorial health department's website to look for the latest local information on testing sites. Separate yourself from other people As much as possible, stay in a specific room and away from other people and pets in your home. If possible, you should use a separate bathroom. If you need to be around other people or animals in or outside of the home, wear a well-fitting mask. Tell your close contacts that they may have been exposed to COVID-19. An infected person can spread COVID-19 starting 48 hours (or 2 days) before the person has any symptoms or tests  positive. By letting your close contacts know they may have been exposed to COVID-19, you are helping to protect everyone. See COVID-19 and Animals if you have questions about pets. If you are diagnosed with COVID-19, someone from the health department may call you. Answer the call to slow the spread. Monitor your symptoms Symptoms of COVID-19 include fever, cough, or other symptoms. Follow care instructions from your healthcare provider and local health department. Your local health authorities may give instructions on checking your symptoms and reporting information. When to seek emergency medical attention Look for emergency warning signs* for COVID-19. If someone is showing any of these signs, seek emergency medical care immediately: Trouble breathing Persistent pain or pressure in the chest New confusion Inability to wake or stay awake Pale, gray, or blue-colored skin, lips, or nail beds, depending on skin tone *This list is not all possible symptoms. Please call your medical provider for any other symptoms that are severe or concerning to you. Call 911 or call ahead to your local emergency facility: Notify the operator that you are seeking care for someone who has or may have COVID-19. Call ahead before visiting your doctor Call ahead. Many medical visits for routine care are being postponed or done by phone or telemedicine. If you have a medical appointment that cannot be postponed, call your doctor's office, and tell them you have or may have COVID-19. This will help the office protect themselves and other patients. If you are sick, wear a well-fitting mask You should wear a mask if you must be around other people or animals, including pets (even at home). Wear a mask with the best fit, protection, and comfort for you. You don't need to wear the mask if you are alone. If you can't put on a mask (because of trouble breathing, for example), cover your coughs and sneezes in some other way.  Try  to stay at least 6 feet away from other people. This will help protect the people around you. Masks should not be placed on young children under age 52 years, anyone who has trouble breathing, or anyone who is not able to remove the mask without help. Cover your coughs and sneezes Cover your mouth and nose with a tissue when you cough or sneeze. Throw away used tissues in a lined trash can. Immediately wash your hands with soap and water for at least 20 seconds. If soap and water are not available, clean your hands with an alcohol-based hand sanitizer that contains at least 60% alcohol. Clean your hands often Wash your hands often with soap and water for at least 20 seconds. This is especially important after blowing your nose, coughing, or sneezing; going to the bathroom; and before eating or preparing food. Use hand sanitizer if soap and water are not available. Use an alcohol-based hand sanitizer with at least 60% alcohol, covering all surfaces of your hands and rubbing them together until they feel dry. Soap and water are the best option, especially if hands are visibly dirty. Avoid touching your eyes, nose, and mouth with unwashed hands. Handwashing Tips Avoid sharing personal household items Do not share dishes, drinking glasses, cups, eating utensils, towels, or bedding with other people in your home. Wash these items thoroughly after using them with soap and water or put in the dishwasher. Clean surfaces in your home regularly Clean and disinfect high-touch surfaces (for example, doorknobs, tables, handles, light switches, and countertops) in your "sick room" and bathroom. In shared spaces, you should clean and disinfect surfaces and items after each use by the person who is ill. If you are sick and cannot clean, a caregiver or other person should only clean and disinfect the area around you (such as your bedroom and bathroom) on an as needed basis. Your caregiver/other person should wait  as long as possible (at least several hours) and wear a mask before entering, cleaning, and disinfecting shared spaces that you use. Clean and disinfect areas that may have blood, stool, or body fluids on them. Use household cleaners and disinfectants. Clean visible dirty surfaces with household cleaners containing soap or detergent. Then, use a household disinfectant. Use a product from [employer] List N: Disinfectants for Coronavirus (COVID-19). Be sure to follow the instructions on the label to ensure safe and effective use of the product. Many products recommend keeping the surface wet with a disinfectant for a certain period of time (look at "contact time" on the product label). You may also need to wear personal protective equipment, such as gloves, depending on the directions on the product label. Immediately after disinfecting, wash your hands with soap and water for 20 seconds. For completed guidance on cleaning and disinfecting your home, visit Complete Disinfection Guidance. Take steps to improve ventilation at home Improve ventilation (air flow) at home to help prevent from spreading COVID-19 to other people in your household. Clear out COVID-19 virus particles in the air by opening windows, using air filters, and turning on fans in your home. Use this interactive tool to learn how to improve air flow in your home. When you can be around others after being sick with COVID-19 Deciding when you can be around others is different for different situations. Find out when you can safely end home isolation. For any additional questions about your care, contact your healthcare provider or state or local health department. 07/11/2020 Content source: [facility] for [employer]  and Respiratory Diseases ([facility]), Division of Viral Diseases This information is not intended to replace advice given to you by your health care provider. Make sure you discuss any questions you have with your health care  provider. Document Revised: 08/24/2020 Document Reviewed: 08/24/2020 Elsevier Patient Education  2022 [employer].     If you have been instructed to have an in-person evaluation today at a local Urgent Care facility, please use the link below. It will take you to a list of all of our available [place] [facility], including address, phone number and hours of operation. Please do not delay care.  [place] [facility]  If you or a family member do not have a primary care provider, use the link below to schedule a visit and establish care. When you choose a [facility] primary care physician or advanced practice provider, you gain a long-term partner in health. Find a Primary Care Provider  Learn more about [facility] in-office and virtual care options: [place] - Get Care Now

## 2022-12-14 ENCOUNTER — Telehealth: Payer: Medicare HMO | Admitting: Nurse Practitioner

## 2022-12-14 DIAGNOSIS — U071 COVID-19: Secondary | ICD-10-CM | POA: Diagnosis not present

## 2022-12-14 MED ORDER — ONDANSETRON HCL 4 MG PO TABS: 4.0000 mg | ORAL_TABLET | Freq: Three times a day (TID) | ORAL | 0 refills | Status: DC | PRN

## 2022-12-14 MED ORDER — NIRMATRELVIR/RITONAVIR (PAXLOVID)TABLET: 3.0000 | ORAL_TABLET | Freq: Two times a day (BID) | ORAL | 0 refills | Status: AC

## 2022-12-14 NOTE — Patient Instructions (Signed)
Nathan Mack, thank you for joining Nathan Pierini, FNP for today's virtual visit.  While this provider is not your primary care provider (PCP), if your PCP is located in our provider database this encounter information will be shared with them immediately following your visit.   A [place] MyChart account gives you access to today's visit and all your visits, tests, and labs performed at [facility] " click here if you don't have a [place] MyChart account or go to [email or web address]  Consent: (Patient) Nathan Mack provided verbal consent for this virtual visit at the beginning of the encounter.  Current Medications:  Current Outpatient Medications:    nirmatrelvir/ritonavir (PAXLOVID) 20 x 150 MG & 10 x 100MG  TABS, Take 3 tablets by mouth 2 (two) times daily for 5 days. (Take nirmatrelvir 150 mg two tablets twice daily for 5 days and ritonavir 100 mg one tablet twice daily for 5 days) Patient GFR is >60, Disp: 30 tablet, Rfl: 0   ondansetron (ZOFRAN) 4 MG tablet, Take 1 tablet (4 mg total) by mouth every 8 (eight) hours as needed for nausea or vomiting., Disp: 20 tablet, Rfl: 0   albuterol (VENTOLIN HFA) 108 (90 Base) MCG/ACT inhaler, , Disp: , Rfl:    benzonatate (TESSALON) 100 MG capsule, Take 1 capsule (100 mg total) by mouth 3 (three) times daily as needed., Disp: 30 capsule, Rfl: 0   calcium carbonate (CALCIUM 600) 1500 (600 Ca) MG TABS tablet, Take 1,500 mg by mouth daily., Disp: , Rfl:    Cholecalciferol (VITAMIN D-3) 125 MCG (5000 UT) TABS, Take 5,000 Units by mouth daily., Disp: , Rfl:    Ferrous Sulfate (IRON SUPPLEMENT PO), Take 1 tablet by mouth once a week. , Disp: , Rfl:    fluticasone (FLONASE) 50 MCG/ACT nasal spray, Place 2 sprays into both nostrils daily., Disp: 16 g, Rfl: 6   folic acid (FOLVITE) 800 MCG tablet, Take 800 mcg by mouth daily. , Disp: , Rfl:    levETIRAcetam (KEPPRA) 500 MG tablet, Take by mouth., Disp: , Rfl:    loratadine  (CLARITIN) 10 MG tablet, TAKE 1 TABLET BY MOUTH EVERY DAY, Disp: 90 tablet, Rfl: 0   molnupiravir EUA (LAGEVRIO) 200 mg CAPS capsule, Take 4 capsules (800 mg total) by mouth 2 (two) times daily for 5 days., Disp: 40 capsule, Rfl: 0   montelukast (SINGULAIR) 10 MG tablet, TAKE 1 TABLET BY MOUTH EVERYDAY AT BEDTIME, Disp: 90 tablet, Rfl: 1   pantoprazole (PROTONIX) 20 MG tablet, Take 1 tablet (20 mg total) by mouth every morning. One hour before breakfast, Disp: 30 tablet, Rfl: 5   phenytoin (DILANTIN) 100 MG ER capsule, Take 3 capsules (300 mg total) by mouth at bedtime., Disp: 360 capsule, Rfl: 1   vitamin B-12 (CYANOCOBALAMIN) 1000 MCG tablet, Take 1,000 mcg by mouth daily., Disp: , Rfl:    Medications ordered in this encounter:  Meds ordered this encounter  Medications   ondansetron (ZOFRAN) 4 MG tablet    Sig: Take 1 tablet (4 mg total) by mouth every 8 (eight) hours as needed for nausea or vomiting.    Dispense:  20 tablet    Refill:  0    Order Specific Question:   Supervising Provider    Answer:   Merrilee Jansky [id number]   nirmatrelvir/ritonavir (PAXLOVID) 20 x 150 MG & 10 x 100MG  TABS    Sig: Take 3 tablets by mouth 2 (two) times daily for 5 days. (Take nirmatrelvir 150 mg two tablets  twice daily for 5 days and ritonavir 100 mg one tablet twice daily for 5 days) Patient GFR is >60    Dispense:  30 tablet    Refill:  0    Order Specific Question:   Supervising Provider    Answer:   Merrilee Jansky [id number]     *If you need refills on other medications prior to your next appointment, please contact your pharmacy*  Follow-Up: Call back or seek an in-person evaluation if the symptoms worsen or if the condition fails to improve as anticipated.  [facility] Health Virtual Care [phone]  Other Instructions 1. Take meds as prescribed 2. Use a cool mist humidifier especially during the winter months and when heat has been humid. 3. Use saline nose sprays frequently 4. Saline  irrigations of the nose can be very helpful if done frequently.  * 4X daily for 1 week*  * Use of a nettie pot can be helpful with this. Follow directions with this* 5. Drink plenty of fluids 6. Keep thermostat turn down low 7.For any cough or congestion- mucinex 8. For fever or aces or pains- take tylenol or ibuprofen appropriate for age and weight.  * for fevers greater than 101 orally you may alternate ibuprofen and tylenol every  3 hours.      If you have been instructed to have an in-person evaluation today at a local Urgent Care facility, please use the link below. It will take you to a list of all of our available [place] [facility], including address, phone number and hours of operation. Please do not delay care.  [place] [facility]  If you or a family member do not have a primary care provider, use the link below to schedule a visit and establish care. When you choose a [facility] primary care physician or advanced practice provider, you gain a long-term partner in health. Find a Primary Care Provider  Learn more about [place]'s in-office and virtual care options: [place] - Get Care Now

## 2022-12-14 NOTE — Progress Notes (Signed)
Virtual Visit Consent   Nathan Mack, you are scheduled for a virtual visit with Nathan Daphine Deutscher, FNP, a [facility] provider, today.     Just as with appointments in the office, your consent must be obtained to participate.  Your consent will be active for this visit and any virtual visit you may have with one of our providers in the next 365 days.     If you have a MyChart account, a copy of this consent can be sent to you electronically.  All virtual visits are billed to your insurance company just like a traditional visit in the office.    As this is a virtual visit, video technology does not allow for your provider to perform a traditional examination.  This may limit your provider's ability to fully assess your condition.  If your provider identifies any concerns that need to be evaluated in person or the need to arrange testing (such as labs, EKG, etc.), we will make arrangements to do so.     Although advances in technology are sophisticated, we cannot ensure that it will always work on either your end or our end.  If the connection with a video visit is poor, the visit may have to be switched to a telephone visit.  With either a video or telephone visit, we are not always able to ensure that we have a secure connection.     I need to obtain your verbal consent now.   Are you willing to proceed with your visit today? YES   Shedric Iadarola has provided verbal consent on 12/14/2022 for a virtual visit (video or telephone).   Nathan Daphine Deutscher, FNP   Date: 12/14/2022 10:48 AM   Virtual Visit via Video Note   I, Nathan Mack, connected with Nathan Mack ([id number], 1955-12-30) on 12/14/22 at 10:45 AM EDT by a video-enabled telemedicine application and verified that I am speaking with the correct person using two identifiers.  Location: Patient: Virtual Visit Location Patient: Home Provider: Virtual Visit Location Provider: Mobile   I discussed the limitations of evaluation  and management by telemedicine and the availability of in person appointments. The patient expressed understanding and agreed to proceed.    History of Present Illness: Nathan Mack is a 67 y.o. who identifies as a male who was assigned male at birth, and is being seen today for covid positive .  HPI: URI  This is a new problem. The current episode started in the past 7 days. The problem has been gradually worsening. Associated symptoms include congestion, coughing, nausea, rhinorrhea and a sore throat. Associated symptoms comments: diarrhea. The treatment provided mild relief.   Tested positive for covid and was given molnupivir and to expensive.  Review of Systems  HENT:  Positive for congestion, rhinorrhea and sore throat.   Respiratory:  Positive for cough.   Gastrointestinal:  Positive for nausea.    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   COPD (chronic obstructive pulmonary disease) (HCC) 02/06/2018   Benign neoplasm of descending colon    Benign neoplasm of transverse colon    Benign neoplasm of ascending colon    Gastritis without bleeding    Other diseases of stomach and duodenum    Need for vaccination  08/06/2017   Family history of colon cancer in mother 08/06/2017   Wellness examination 11/14/2015   Tobacco abuse counseling 02/16/2015   Encounter for annual health examination 11/10/2014   Seizure disorder, grand mal (HCC) 11/10/2014   Snoring 11/10/2014    Allergies:  Allergies  Allergen Reactions   Codeine    Hydromorphone    Morphine    Medications:  Current Outpatient Medications:    albuterol (VENTOLIN HFA) 108 (90 Base) MCG/ACT inhaler, , Disp: , Rfl:    benzonatate (TESSALON) 100 MG capsule, Take 1 capsule (100 mg total) by mouth 3 (three)  times daily as needed., Disp: 30 capsule, Rfl: 0   calcium carbonate (CALCIUM 600) 1500 (600 Ca) MG TABS tablet, Take 1,500 mg by mouth daily., Disp: , Rfl:    Cholecalciferol (VITAMIN D-3) 125 MCG (5000 UT) TABS, Take 5,000 Units by mouth daily., Disp: , Rfl:    Ferrous Sulfate (IRON SUPPLEMENT PO), Take 1 tablet by mouth once a week. , Disp: , Rfl:    fluticasone (FLONASE) 50 MCG/ACT nasal spray, Place 2 sprays into both nostrils daily., Disp: 16 g, Rfl: 6   folic acid (FOLVITE) 800 MCG tablet, Take 800 mcg by mouth daily. , Disp: , Rfl:    levETIRAcetam (KEPPRA) 500 MG tablet, Take by mouth., Disp: , Rfl:    loratadine (CLARITIN) 10 MG tablet, TAKE 1 TABLET BY MOUTH EVERY DAY, Disp: 90 tablet, Rfl: 0   molnupiravir EUA (LAGEVRIO) 200 mg CAPS capsule, Take 4 capsules (800 mg total) by mouth 2 (two) times daily for 5 days., Disp: 40 capsule, Rfl: 0   montelukast (SINGULAIR) 10 MG tablet, TAKE 1 TABLET BY MOUTH EVERYDAY AT BEDTIME, Disp: 90 tablet, Rfl: 1   pantoprazole (PROTONIX) 20 MG tablet, Take 1 tablet (20 mg total) by mouth every morning. One hour before breakfast, Disp: 30 tablet, Rfl: 5   phenytoin (DILANTIN) 100 MG ER capsule, Take 3 capsules (300 mg total) by mouth at bedtime., Disp: 360 capsule, Rfl: 1   vitamin B-12 (CYANOCOBALAMIN) 1000 MCG tablet, Take 1,000 mcg by mouth daily., Disp: , Rfl:   Observations/Objective: Patient is well-developed, well-nourished in no acute distress.  Resting comfortably  at home.  Head is normocephalic, atraumatic.  No labored breathing.  Speech is clear and coherent with logical content.  Patient is alert and oriented at baseline.  Raspy voice Dry cough  Assessment and Plan:  [name] in today with chief complaint of Covid Positive (/)   1. Positive self-administered antigen test for COVID-19 1. Take meds as prescribed 2. Use a cool mist humidifier especially during the winter months and when heat has been humid. 3. Use saline nose  sprays frequently 4. Saline irrigations of the nose can be very helpful if done frequently.  * 4X daily for 1 week*  * Use of a nettie pot can be helpful with this. Follow directions with this* 5. Drink plenty of fluids 6. Keep thermostat turn down low 7.For any cough or congestion- mucinex 8. For fever or aces or pains- take tylenol or ibuprofen appropriate for age and weight.  * for fevers greater than 101 orally you may alternate ibuprofen and tylenol every  3 hours.   Zofran for nausea Imodium for diarrhea  Meds ordered this encounter  Medications   ondansetron (ZOFRAN) 4 MG tablet    Sig: Take 1 tablet (4 mg total) by mouth every 8 (eight) hours as needed for nausea or vomiting.    Dispense:  20 tablet    Refill:  0    Order Specific Question:   Supervising Provider    Answer:   Merrilee Jansky [id number]   nirmatrelvir/ritonavir (PAXLOVID) 20 x 150 MG & 10 x 100MG  TABS    Sig: Take 3 tablets by mouth 2 (two) times daily for 5 days. (Take nirmatrelvir 150 mg two tablets twice daily for 5 days and ritonavir 100 mg one tablet twice daily for 5 days) Patient GFR is >60    Dispense:  30 tablet    Refill:  0    Order Specific Question:   Supervising Provider    Answer:   Merrilee Jansky [[id number]]       Follow Up Instructions: I discussed the assessment and treatment plan with the patient. The patient was provided an opportunity to ask questions and all were answered. The patient agreed with the plan and demonstrated an understanding of the instructions.  A copy of instructions were sent to the patient via MyChart.  The patient was advised to call back or seek an in-person evaluation if the symptoms worsen or if the condition fails to improve as anticipated.  Time:  I spent 7 minutes with the patient via telehealth technology discussing the above problems/concerns.    Nathan Daphine Deutscher, FNP

## 2023-07-24 ENCOUNTER — Other Ambulatory Visit: Payer: Self-pay

## 2023-07-24 DIAGNOSIS — F17200 Nicotine dependence, unspecified, uncomplicated: Secondary | ICD-10-CM

## 2023-07-24 DIAGNOSIS — N401 Enlarged prostate with lower urinary tract symptoms: Secondary | ICD-10-CM

## 2023-07-24 DIAGNOSIS — Z136 Encounter for screening for cardiovascular disorders: Secondary | ICD-10-CM

## 2023-07-24 DIAGNOSIS — F17210 Nicotine dependence, cigarettes, uncomplicated: Secondary | ICD-10-CM

## 2023-08-06 ENCOUNTER — Ambulatory Visit

## 2024-02-23 ENCOUNTER — Emergency Department: Payer: Medicare (Managed Care)

## 2024-02-23 ENCOUNTER — Encounter: Payer: Self-pay | Admitting: Emergency Medicine

## 2024-02-23 ENCOUNTER — Other Ambulatory Visit: Payer: Self-pay

## 2024-02-23 ENCOUNTER — Observation Stay
Admission: EM | Admit: 2024-02-23 | Discharge: 2024-02-24 | Disposition: A | Discharge status: Home / Self Care | Payer: Medicare (Managed Care) | Attending: Internal Medicine | Admitting: Internal Medicine

## 2024-02-23 DIAGNOSIS — M79604 Pain in right leg: Principal | ICD-10-CM | POA: Diagnosis present

## 2024-02-23 DIAGNOSIS — R29898 Other symptoms and signs involving the musculoskeletal system: Secondary | ICD-10-CM

## 2024-02-23 DIAGNOSIS — M79606 Pain in leg, unspecified: Secondary | ICD-10-CM | POA: Diagnosis present

## 2024-02-23 DIAGNOSIS — R531 Weakness: Secondary | ICD-10-CM | POA: Insufficient documentation

## 2024-02-23 LAB — COMPREHENSIVE METABOLIC PANEL WITH GFR
ALT: 19 U/L (ref 0–44)
AST: 22 U/L (ref 15–41)
Albumin: 4 g/dL (ref 3.5–5.0)
Alkaline Phosphatase: 88 U/L (ref 38–126)
Anion gap: 13 (ref 5–15)
BUN: 18 mg/dL (ref 8–23)
CO2: 22 mmol/L (ref 22–32)
Calcium: 8.7 mg/dL — ABNORMAL LOW (ref 8.9–10.3)
Chloride: 98 mmol/L (ref 98–111)
Creatinine, Ser: 1.07 mg/dL (ref 0.61–1.24)
GFR, Estimated: >60 mL/min (ref >60)
Glucose, Bld: 88 mg/dL (ref 70–99)
Potassium: 3.5 mmol/L (ref 3.5–5.1)
Sodium: 133 mmol/L — ABNORMAL LOW (ref 135–145)
Total Bilirubin: 0.6 mg/dL (ref 0.0–1.2)
Total Protein: 7 g/dL (ref 6.5–8.1)

## 2024-02-23 LAB — CBC WITH DIFFERENTIAL/PLATELET
Abs Immature Granulocytes: 0.04 K/uL (ref 0.00–0.07)
Basophils Absolute: 0.1 K/uL (ref 0.0–0.1)
Basophils Relative: 1 %
Eosinophils Absolute: 0.2 K/uL (ref 0.0–0.5)
Eosinophils Relative: 2 %
HCT: 35.2 % — ABNORMAL LOW (ref 39.0–52.0)
Hemoglobin: 12.5 g/dL — ABNORMAL LOW (ref 13.0–17.0)
Immature Granulocytes: 0 %
Lymphocytes Relative: 22 %
Lymphs Abs: 2.7 K/uL (ref 0.7–4.0)
MCH: 34.3 pg — ABNORMAL HIGH (ref 26.0–34.0)
MCHC: 35.5 g/dL (ref 30.0–36.0)
MCV: 96.7 fL (ref 80.0–100.0)
Monocytes Absolute: 0.6 K/uL (ref 0.1–1.0)
Monocytes Relative: 5 %
Neutro Abs: 8.9 K/uL — ABNORMAL HIGH (ref 1.7–7.7)
Neutrophils Relative %: 70 %
Platelets: 235 K/uL (ref 150–400)
RBC: 3.64 MIL/uL — ABNORMAL LOW (ref 4.22–5.81)
RDW: 12.6 % (ref 11.5–15.5)
WBC: 12.5 K/uL — ABNORMAL HIGH (ref 4.0–10.5)
nRBC: 0 % (ref 0.0–0.2)

## 2024-02-23 MED ORDER — FENTANYL CITRATE (PF) 50 MCG/ML IJ SOSY
50.0000 ug | PREFILLED_SYRINGE | Freq: Once | INTRAMUSCULAR | Status: AC
  Administered 2024-02-23: 50 ug via INTRAVENOUS
  Filled 2024-02-23: qty 1

## 2024-02-23 MED ORDER — IOHEXOL 350 MG/ML SOLN
125.0000 mL | Freq: Once | INTRAVENOUS | Status: AC | PRN
  Administered 2024-02-23: 125 mL via INTRAVENOUS

## 2024-02-23 MED ORDER — HYDROMORPHONE HCL 1 MG/ML IJ SOLN
1.0000 mg | INTRAMUSCULAR | Status: AC
  Administered 2024-02-23: 1 mg via INTRAVENOUS
  Filled 2024-02-23: qty 1

## 2024-02-23 MED ORDER — DROPERIDOL 2.5 MG/ML IJ SOLN
2.5000 mg | Freq: Once | INTRAMUSCULAR | Status: AC
  Administered 2024-02-23: 2.5 mg via INTRAVENOUS
  Filled 2024-02-23: qty 2

## 2024-02-23 NOTE — ED Notes (Signed)
 This RN notified CT of need to do CT scan more urgently to rule out dissection. CT reports he will be next due to acuity.

## 2024-02-23 NOTE — ED Triage Notes (Addendum)
 Pt arrives from home w/ [facility] w/ c/o sudden onset of R leg pain in thigh & below the knee. Faint pulses in foot. Denies injuries to area. Pain 10/10. Never had similar experience.  138/74 100 HR  97% RA Hx seizures

## 2024-02-23 NOTE — ED Notes (Signed)
 Pulses felt in bilateral feet. Both strong and regular.

## 2024-02-23 NOTE — ED Provider Notes (Incomplete)
 [facility] Provider Note    Event Date/Time   First MD Initiated Contact with Patient 02/23/24 2255     (approximate)   History   Leg Pain   HPI Nathan Mack is a 68 y.o. male who reports a history of mild COPD, epilepsy on antiepileptic medication, and tobacco use but denies any history of vascular or cardiac issues.  He presents for evaluation of acute onset severe right leg pain.  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  He said most recently the pain seems to also be present in his pelvis and now his left leg is starting to feel a little bit numb although it is not hurting.  He said the pain is excruciating and he has never felt anything like it.  He has no history of blood clots in the legs of the lungs.  His leg has not swollen and has not changed colors.  He had no trauma or recent fall.  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     Physical Exam   Triage Vital Signs: ED Triage Vitals [02/23/24 2159]  Encounter Vitals Group     BP (!) 134/104     Girls Systolic BP Percentile      Girls Diastolic BP Percentile      Boys Systolic BP Percentile      Boys Diastolic BP Percentile      Pulse Rate 87     Resp (!) 22     Temp 97.7 F (36.5 C)     Temp Source Oral     SpO2 100 %     Weight      Height      Head Circumference      Peak Flow      Pain Score 10     Pain Loc      Pain Education      Exclude from Growth Chart     Most recent vital signs: Vitals:   02/24/24 0726 02/24/24 0729  BP:    Pulse:    Resp:    Temp:  98.6 F (37 C)  SpO2: 100%     General: Awake, alert, but in severe pain, cannot hold still. CV:  Good peripheral perfusion.  Easily palpable distal pedal pulses in  right foot, equal to left.  Both extremities are warm with normal capillary refill.  No pulsatile mass in the patient's groin, femoral artery pulses palpable. Resp:  Normal effort. Speaking easily and comfortably, no accessory muscle usage nor intercostal retractions.   Abd:  No distention.  No tenderness to palpation of the abdomen and no pulsatile abdominal masses. MSK:  No visible or palpable abnormalities of the patient's right leg and hip but he has severe pain with any amount of movement of the leg including when I accidentally brushed against his foot which externally rotated his leg and he screamed.  Compartments are soft and easily compressible yet tender.  There is no erythema or areas of induration or fluctuance to suggest infection.   ED Results / Procedures / Treatments   Labs (all labs ordered are listed, but only abnormal results are displayed) Labs Reviewed  CBC WITH DIFFERENTIAL/PLATELET - Abnormal; Notable for the following components:      Result Value   WBC 12.5 (*)    RBC 3.64 (*)    Hemoglobin 12.5 (*)    HCT 35.2 (*)    MCH 34.3 (*)    Neutro Abs 8.9 (*)    All other components within normal limits  COMPREHENSIVE METABOLIC PANEL WITH GFR - Abnormal; Notable for the following components:   Sodium 133 (*)    Calcium 8.7 (*)    All other components within normal limits  CK       RADIOLOGY See ED course for details   PROCEDURES:  Critical Care performed: No  .1-3 Lead EKG Interpretation  Performed by: Gordan Huxley, MD Authorized by: Gordan Huxley, MD     Interpretation: normal     ECG rate:  85   ECG rate assessment: normal     Rhythm: sinus rhythm     Ectopy: none     Conduction: normal       IMPRESSION / MDM / ASSESSMENT AND PLAN / ED COURSE  I reviewed the triage vital signs and the nursing notes.                              Differential diagnosis includes, but is not limited to, arterial dissection, arterial aneurysm, popliteal  arterial entrapment syndrome, possible involvement of iliac  arteries, DVT, pathological fracture.  Patient's presentation is most consistent with acute presentation with potential threat to life or bodily function.  Labs/studies ordered: CMP, CBC with differential, CTA aorta bifemoral skin  Interventions/Medications given:  Medications  fentaNYL  (SUBLIMAZE ) injection 50 mcg (50 mcg Intravenous Given 02/23/24 2226)  HYDROmorphone (DILAUDID) injection 1 mg (1 mg Intravenous Given 02/23/24 2314)  droperidol (INAPSINE) 2.5 MG/ML injection 2.5 mg (2.5 mg Intravenous Given 02/23/24 2314)  iohexol (OMNIPAQUE) 350 MG/ML injection 125 mL (125 mLs Intravenous Contrast Given 02/23/24 2327)  HYDROmorphone (DILAUDID) injection 1 mg (1 mg Intravenous Given 02/24/24 0426)    (Note:  hospital course my include additional interventions and/or labs/studies not listed above.)   Patient is in severe pain and although I can palpate pulses in his leg and his perfusion seems appropriate, the degree of pain he is experiencing would suggest a vascular/arterial cause.  His labs are essentially normal.  I will proceed with CTA to rule out arterial emergency in his right lower extremity and/or pelvis.  Patient received a dose of fentanyl  50 mcg prior to my arrival and it had very little effect.  I ordered Dilaudid 1 mg IV as well as droperidol 2.5 mg IV as an adjuvant medication to help him be comfortable for the CT scan.  I checked an old EKG and verified he has no history of QTc prolongation  The patient is on the cardiac monitor to evaluate for evidence of arrhythmia and/or significant heart rate changes.   Clinical Course as of 02/24/24 1645  Tue Feb 24, 2024  0016 CT Angio Aortobifemoral W and/or Wo Contrast I independently viewed and interpreted the patient's CTA and somewhat surprisingly there is no indication of any arterial compromise or abnormality, nor of any soft tissue defect or bony abnormality or injury  to explain the patient's pain.  I will reassess and see how he is feeling after his last round of medication. [CF]  0032 I reassessed the patient.  He is feeling much better.  He said it is still  sore, but I am able to palpate and move his leg without him experiencing any discomfort.  He is able to dorsiflex and plantarflex without any pain which is a big improvement from before.  He is still feeling the effects of the Dilaudid and the droperidol, but given the lack of any indication of an emergent medical condition, we will give him a little bit of time and then try to ambulate him to see if this recreates the pain.  He and his wife and son-in-law (the latter 2 of which are in the room with him) agree with the plan. [CF]  0155 Patient said he felt better, and the pain was mostly gone, but when he tried to get up to test ambulation, he was not able to lift his right leg at all.  He claims it feels like dead weight.  Given the lack of findings on the CTA to explain his symptoms, I am going to proceed with MRI of the thoracic and lumbar spine.  I cannot think of any reason why a presenting complaint of severe leg pain would be from a CVA, nor for that matter, from the cervical spine.  However, it is possible he has a thoracic or lumbar spine lesion, epidural abscess, or cauda equina syndrome that could be resulting in his symptoms.  I reassessed the patient and he claims to not feel well and I introduced a noxious stimulus to his thigh on the right side but he can feel it on the left and reacts appropriately.  He is able to lift his left leg up off the bed and hold it at a 45 degree angle.  When I ask him to do on the right he said he cannot and I can see a few twitching of muscle fibers but he seems to legitimately not be able to lift it.  Given his age, prior history of a benign tumor on his colon, smoking history, etc., I will obtain MRI with and without contrast of the thoracic spine and lumbar spine for  the broadest possible evaluation of potential neurosurgical emergency. [CF]  0530 I independently viewed and interpreted the patient's MRIs and I cannot appreciate any acute abnormalities to explain the patient's symptoms.  There is no obvious neurological impingement or mass.  Patient could not tolerate repeat imaging with contrast and refused even with pain medicine. [CF]  0604 I reassessed the patient and he says he is more comfortable but he still cannot move his leg.  He said that it is hard to describe, but when he tries to move it it hurts and he feels like it is very heavy and he cannot get it to move.  He claims that it feels numb when I touch it on the anterior thigh.  When I briskly stroke the bottom of his foot his muscles twitch but he does not jerk his leg away.  When I passively extend his leg and flex his knee, he says it causes pain in the thigh.  I still strongly doubt CVA, but the patient may have some sort of myopathy.  Regardless he needs additional evaluation given the new onset neurological deficit associated with pain.  The patient does not want to go back to MRI right now so I will defer additional imaging hospitalist and neurology consultants.  I am consulting the hospitalist for admission Given the intractable pain and new neurological deficit in the setting of intractable pain [CF]  0628 I consulted by phone with the admitting  hospitalist, and they will admit the patient - Dr. Lawence.  I also ordered a Doppler ultrasound as per Dr. Debbie request; I think that the possibility of DVT is exceedingly low, but I think it is a reasonable study to get as well.  I also ordered a CK given the possibility of myopathy [CF]  0717 I reassessed the patient and he says he is more comfortable but he still cannot move his leg.  He said that it is hard to describe, but when he tries to move it it hurts and he feels like it is very heavy and he cannot get it to move.  He claims that it feels numb  when I touch it on the anterior thigh.  When I briskly stroke the bottom of his foot his muscles twitch but he does not jerk his leg away.  When I passively extend his leg and flex his knee, he says it causes pain in the thigh.  I still strongly doubt CVA, but the patient may have some sort of myopathy.  Regardless he needs additional evaluation given the new onset neurological deficit associated with pain.  The patient does not want to go back to MRI right now so I will defer additional imaging hospitalist and neurology consultants.  I am consulting the hospitalist for admission.  [CF]    Clinical Course User Index [CF] Gordan Huxley, MD     FINAL CLINICAL IMPRESSION(S) / ED DIAGNOSES   Final diagnoses:  Acute leg pain, right  Right leg weakness     Rx / DC Orders   ED Discharge Orders          Ordered    Discharge instructions        02/24/24 0755             Note:  This document was prepared using Dragon voice recognition software and may include unintentional dictation errors.   Gordan Huxley, MD 02/24/24 9357    Gordan Huxley, MD 02/24/24 [phone]

## 2024-02-23 NOTE — ED Notes (Signed)
 Pt transported to CT ?

## 2024-02-23 NOTE — ED Provider Notes (Incomplete)
 [facility] Provider Note    Event Date/Time   First MD Initiated Contact with Patient 02/23/24 2255     (approximate)   History   Leg Pain   HPI Nathan Mack is a 68 y.o. male who reports a history of mild COPD, epilepsy on antiepileptic medication, and tobacco use but denies any history of vascular or cardiac issues.  He presents for evaluation of acute onset severe right leg pain.  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  He said most recently the pain seems to also be present in his pelvis and now his left leg is starting to feel a little bit numb although it is not hurting.  He said the pain is excruciating and he has never felt anything like it.  He has no history of blood clots in the legs of the lungs.  His leg has not swollen and has not changed colors.  He had no trauma or recent fall.  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     Physical Exam   Triage Vital Signs: ED Triage Vitals [02/23/24 2159]  Encounter Vitals Group     BP (!) 134/104     Girls Systolic BP Percentile      Girls Diastolic BP Percentile      Boys Systolic BP Percentile      Boys Diastolic BP Percentile      Pulse Rate 87     Resp (!) 22     Temp 97.7 F (36.5 C)     Temp Source Oral     SpO2 100 %     Weight      Height      Head Circumference      Peak Flow      Pain Score 10     Pain Loc      Pain Education      Exclude from Growth Chart     Most recent vital signs: Vitals:   02/23/24 2159  BP: (!) 134/104  Pulse: 87  Resp: (!) 22  Temp: 97.7 F (36.5 C)  SpO2: 100%    General: Awake, alert, but in severe pain, cannot hold still. CV:  Good peripheral perfusion.  Easily palpable distal pedal pulses  in right foot, equal to left.  Both extremities are warm with normal capillary refill.  No pulsatile mass in the patient's groin, femoral artery pulses palpable. Resp:  Normal effort. Speaking easily and comfortably, no accessory muscle usage nor intercostal retractions.   Abd:  No distention.  No tenderness to palpation of the abdomen and no pulsatile abdominal masses.   ED Results / Procedures / Treatments   Labs (all labs ordered are listed, but only abnormal results are displayed) Labs Reviewed  CBC WITH DIFFERENTIAL/PLATELET - Abnormal; Notable for the following components:      Result Value   WBC 12.5 (*)    RBC 3.64 (*)    Hemoglobin 12.5 (*)    HCT 35.2 (*)    MCH 34.3 (*)    Neutro Abs 8.9 (*)    All other components within normal limits  COMPREHENSIVE METABOLIC PANEL WITH GFR - Abnormal; Notable  for the following components:   Sodium 133 (*)    Calcium 8.7 (*)    All other components within normal limits     EKG  ***   RADIOLOGY See ED course for details   PROCEDURES:  Critical Care performed: {CriticalCareYesNo:19197::Yes, see critical care procedure note(s),No}  Procedures    IMPRESSION / MDM / ASSESSMENT AND PLAN / ED COURSE  I reviewed the triage vital signs and the nursing notes.                              Differential diagnosis includes, but is not limited to, arterial dissection, arterial aneurysm, popliteal arterial entrapment syndrome, possible involvement of iliac  arteries, DVT, pathological fracture.  Patient's presentation is most consistent with acute presentation with potential threat to life or bodily function.  Labs/studies ordered: CMP, CBC with differential, CTA aorta bifemoral skin  Interventions/Medications given:  Medications  HYDROmorphone (DILAUDID) injection 1 mg (has no administration in time range)  droperidol (INAPSINE) 2.5 MG/ML injection 2.5 mg (has no administration in time range)  fentaNYL  (SUBLIMAZE ) injection  50 mcg (50 mcg Intravenous Given 02/23/24 2226)    (Note:  hospital course my include additional interventions and/or labs/studies not listed above.)   ***  The patient is on the cardiac monitor to evaluate for evidence of arrhythmia and/or significant heart rate changes.       FINAL CLINICAL IMPRESSION(S) / ED DIAGNOSES   Final diagnoses:  None     Rx / DC Orders   ED Discharge Orders     None        Note:  This document was prepared using Dragon voice recognition software and may include unintentional dictation errors.

## 2024-02-24 ENCOUNTER — Emergency Department: Payer: Medicare (Managed Care)

## 2024-02-24 DIAGNOSIS — M79604 Pain in right leg: Secondary | ICD-10-CM | POA: Diagnosis present

## 2024-02-24 LAB — CK: Total CK: 106 U/L (ref 49–397)

## 2024-02-24 MED ORDER — HYDROMORPHONE HCL 1 MG/ML IJ SOLN
1.0000 mg | INTRAMUSCULAR | Status: AC
  Administered 2024-02-24: 1 mg via INTRAVENOUS
  Filled 2024-02-24: qty 1

## 2024-02-24 NOTE — Discharge Summary (Signed)
 Physician Discharge Summary   Patient: Nathan Mack MRN: [id number] DOB: 01-21-1956  Admit date:     02/23/2024  Discharge date: 02/24/24  Discharge Physician: Cort ONEIDA Mana   PCP: Trinidad Cassondra BROCKS, MD   Recommendations at discharge:   PCP in 2 weeks, possible neurosurgery consult outpatient for sciatica treatment  Discharge Diagnoses: Principal Problem:   Right leg pain   Hospital Course:  Patient came in with severe right leg pain this morning, new onset, shooting like from lateral side of the thigh all the way to lateral side of the shin and lateral side of the right foot, constant worsening with movement.  ED workup including a CTA dissection study negative for lower extremity arterial dissection or popliteal artery entrapment syndrome.  Lumbar and thoracic spine MRI showed chronic postoperative changes L4-L5 no spinal stenosis moderate stenosis at bilateral L4-L5 nerve level stenosis, compatible with his symptoms.  Patient symptoms was improving after ED management including pain medication hydration patient patient were reassured and discharged home in stable conditions.       Pain control - [place]  Controlled Substance Reporting System database was reviewed. and patient was instructed, not to drive, operate heavy machinery, perform activities at heights, swimming or participation in water activities or provide baby-sitting services while on Pain, Sleep and Anxiety Medications; until their outpatient Physician has advised to do so again. Also recommended to not to take more than prescribed Pain, Sleep and Anxiety Medications.  Consultants: None Procedures performed: None Disposition: Home Diet recommendation:  Regular diet DISCHARGE MEDICATION: Allergies as of 02/24/2024       Reactions   Codeine    Hydromorphone    Morphine         Medication List     STOP taking these medications    albuterol  108 (90 Base) MCG/ACT inhaler Commonly known as: VENTOLIN  HFA    benzonatate  100 MG capsule Commonly known as: TESSALON    Calcium 600 1500 (600 Ca) MG Tabs tablet Generic drug: calcium carbonate   cyanocobalamin  1000 MCG tablet Commonly known as: VITAMIN B12   fluticasone  50 MCG/ACT nasal spray Commonly known as: FLONASE    folic acid  800 MCG tablet Commonly known as: FOLVITE    IRON SUPPLEMENT PO   levETIRAcetam 500 MG tablet Commonly known as: KEPPRA   loratadine  10 MG tablet Commonly known as: CLARITIN    montelukast  10 MG tablet Commonly known as: SINGULAIR    ondansetron  4 MG tablet Commonly known as: Zofran    pantoprazole  20 MG tablet Commonly known as: Protonix    phenytoin  100 MG ER capsule Commonly known as: DILANTIN    Vitamin D -3 125 MCG (5000 UT) Tabs        Discharge Exam: Eyes: PERRL, lids and conjunctivae normal ENMT: Mucous membranes are moist. Posterior pharynx clear of any exudate or lesions.Normal dentition.  Neck: normal, supple, no masses, no thyromegaly Respiratory: clear to auscultation bilaterally, no wheezing, no crackles. Normal respiratory effort. No accessory muscle use.  Cardiovascular: Regular rate and rhythm, no murmurs / rubs / gallops. No extremity edema. 2+ pedal pulses. No carotid bruits.  Abdomen: no tenderness, no masses palpated. No hepatosplenomegaly. Bowel sounds positive.  Musculoskeletal: no clubbing / cyanosis. No joint deformity upper and lower extremities. Good ROM, no contractures. Normal muscle tone.  Skin: no rashes, lesions, ulcers. No induration Neurologic: CN 2-12 grossly intact. Sensation intact, DTR normal.  Muscle strength 5/5 on both sides Psychiatric: Normal judgment and insight. Alert and oriented x 3. Normal mood.    Condition at discharge:  good  The results of significant diagnostics from this hospitalization (including imaging, microbiology, ancillary and laboratory) are listed below for reference.   Imaging Studies: MR LUMBAR SPINE WO CONTRAST Result Date:  02/24/2024 EXAM: MRI LUMBAR SPINE 02/24/2024 04:36:15 AM TECHNIQUE: Multiplanar multisequence MRI of the lumbar spine was performed without the administration of intravenous contrast. COMPARISON: CTA abdomen and pelvis with bilateral lower extremity runoff 02/23/2024. Thoracic MRI reported separately 02/24/2024. CLINICAL HISTORY: 68 year old male with low back pain, suspected cancer, cauda equina syndrome, and acute lumbar myelopathy. FINDINGS: BONES AND ALIGNMENT: Normal alignment. Normal lumbar segmentation. L5 superior endplate chronic Schmorl node or compression. Relatively maintained lumbar vertebral height. Normal background thin marrow signal. Intact visible sacrum and the side joints. No marrow edema identified. SPINAL CORD: The conus terminates normally at T12-L1. No signal abnormality in the visible lower thoracic spinal cord or conus. SOFT TISSUES: No paraspinal mass. Stable visible abdominal viscera from CTA yesterday. Mild for age mild for age lower thoracic through mid lumbar spine degeneration, with normal spinal canal patency to the L3-L4 level. L4-L5: Previous right laminectomy. Disc space loss with vacuum disc. Circumferential disc osteophyte complex with broad based posterior component of disc, and evidence of small volume caudal disc extrusion and/or ventral dural thickening as seen on series 9 image 8 - which was partially calcified by CT. Mild to moderate facet and ligamentum flavum hypertrophy. Moderate lateral recess stenosis greater on the right (descending L5 nerve levels). Mild left and moderate right L4 neural foraminal stenosis. No significant spinal stenosis. L5-S1: Circumferential disc bulge also with small volume caudal extension of disc in the midline. Mild to moderate facet and ligamentum flavum hypertrophy. No spinal or lateral recess stenosis. Moderate to severe left and moderate right L5 neural foraminal stenosis. IMPRESSION: 1. No acute osseous abnormality in the lumbar spine.  Mild chronic L5 superior endplate compression or schmorl's node. 2. Chronic postoperative changes at L4-L5 on the right and lower lumbar spine disc and posterior element degeneration but No lumbar spinal stenosis. Generally Moderate stenosis at the bilateral L4 and L5 nerve level stenoses (lateral recess and foraminal), but up to severe at the Left L5 nerve level. Electronically signed by: Helayne Hurst MD 02/24/2024 04:58 AM EST RP Workstation: [id number]   MR THORACIC SPINE WO CONTRAST Result Date: 02/24/2024 EXAM: MRI THORACIC SPINE WITHOUT INTRAVENOUS CONTRAST 02/24/2024 04:36:15 AM TECHNIQUE: Multiplanar multisequence MRI of the thoracic spine was performed without the administration of intravenous contrast. COMPARISON: CTA abdomen and pelvis with bilateral lower extremity runoff 02/23/2024. Limited sagittal imaging of the cervical spine is within normal limits for age. CLINICAL HISTORY: 68 year old male  Mid-back pain, neuro deficit, acute myelopathy. FINDINGS: Limited sagittal imaging of the cervical spine is within normal limits for age. BONES AND ALIGNMENT: Normal alignment. Normal vertebral body heights. Bone marrow signal is unremarkable. No abnormal enhancement. Thoracic segmentation appears to be normal. Scattered benign thoracic vertebral hemangiomas (normal variation), most conspicuous at the T7 level. Chronic thoracic degenerative endplate changes including small schmorl nodes, and occasional mild chronic endplate compression (such as superiorly at the T6 and T7 levels). Relatively maintained thoracic vertebral height. No marrow edema. SPINAL CORD: Normal spinal cord volume. Normal spinal cord signal. Normal conus medullaris at T12-L1. SOFT TISSUES: Unremarkable. DEGENERATIVE CHANGES: Generally age appropriate thoracic spine degeneration. Small thoracic disc protrusions are present from T4-T5 through T10-T11 annotated on series 9. These narrow the ventral CSF space without significant thoracic  spinal stenosis and there is no high grade thoracic  neural foraminal stenosis. INCIDENTAL FINDINGS: Benign appearing polycystic renal disease (no follow up imaging recommended). IMPRESSION: 1. No acute finding in the  non-contrast thoracic spine. 2. Generally mild for age thoracic degeneration. No significant thoracic spinal or foraminal stenosis. Electronically signed by: Helayne Hurst MD 02/24/2024 04:50 AM EST RP Workstation: [id number]   CT Angio Aortobifemoral W and/or Wo Contrast Result Date: 02/24/2024 EXAM: CTA ABDOMEN AND PELVIS WITH AND WITHOUT CONTRAST AND RUNOFF CTA OF THE LOWER EXTREMITIES WITH CONTRAST 02/23/2024 11:39:59 PM TECHNIQUE: CTA images of the abdomen, pelvis and lower extremities with and without intravenous contrast. 125 mL of iohexol (OMNIPAQUE) 350 MG/ML injection was administered. Three-dimensional MIP/volume rendered formations were performed. Automated exposure control, iterative reconstruction, and/or weight based adjustment of the mA/kV was utilized to reduce the radiation dose to as low as reasonably achievable. COMPARISON: None available. CLINICAL HISTORY: Lower extremity arterial dissection, known or suspected; Popliteal arterial entrapment syndrome suspected; acute onset severe pain in right popliteal fossa now radiating throughout RLE. FINDINGS: VASCULATURE: AORTA: Aortic atherosclerosis. No acute finding. No abdominal aortic aneurysm. No dissection. CELIAC TRUNK: No acute finding. No occlusion or significant stenosis. SUPERIOR MESENTERIC ARTERY: No acute finding. No occlusion or significant stenosis. INFERIOR MESENTERIC ARTERY: No acute finding. No occlusion or significant stenosis. RENAL ARTERIES: No acute finding. No occlusion or significant stenosis. RIGHT ILIAC ARTERIES: No acute finding. No occlusion or significant stenosis. RIGHT FEMORAL ARTERIES: No acute finding. No occlusion or significant stenosis. RIGHT POPLITEAL ARTERY: No acute finding. No occlusion or significant  stenosis. RIGHT CALF ARTERIES: No acute finding. No occlusion or significant stenosis. 3 vessel runoff to the ankle. LEFT ILIAC ARTERIES: No acute finding. No occlusion or significant stenosis. LEFT FEMORAL ARTERIES: No acute finding. No occlusion or significant stenosis. LEFT POPLITEAL ARTERY: No acute finding. No occlusion or significant stenosis. LEFT CALF ARTERIES: No acute finding. No occlusion or significant stenosis. 3 vessel runoff to the ankle. ABDOMEN AND PELVIS: LOWER CHEST: Visualized portion of the lower chest demonstrates no acute abnormality. LIVER: The liver is unremarkable. GALLBLADDER AND BILE DUCTS: Gallbladder is unremarkable. No biliary ductal dilatation. SPLEEN: The spleen is unremarkable. PANCREAS:. The pancreas is unremarkable. ADRENAL GLANDS: Bilateral adrenal glands demonstrate no acute abnormality. KIDNEYS, URETERS AND BLADDER: Bilateral renal cysts appear simple. Right extrarenal pelvis. No stones in the kidneys or ureters. No hydronephrosis. No evidence of perinephric or periureteral stranding. Urinary bladder is unremarkable. GI AND Bowel: Stomach and duodenal sweep demonstrate no acute abnormality. Normal appendix. There is no bowel obstruction. No abnormal bowel wall thickening or distension. REPRODUCTIVE: Reproductive organs are unremarkable. PERITONEUM AND RETROPERITONEUM: No ascites or free air. LYMPH NODES: No evidence of lymphadenopathy. BONES AND SOFT TISSUES: No acute abnormality of the bones. No acute soft tissue abnormality. IMPRESSION: 1. No evidence of lower extremity arterial dissection or popliteal arterial entrapment syndrome. 2. Aortic atherosclerosis. Electronically signed by: Franky Crease MD 02/24/2024 12:12 AM EST RP Workstation: [id number]    Microbiology: Results for orders placed or performed during the hospital encounter of 11/27/18  SARS CORONAVIRUS 2 Nasal Swab Aptima Multi Swab     Status: None   Collection Time: 11/27/18 11:53 AM   Specimen: Aptima  Multi Swab; Nasal Swab  Result Value Ref Range Status   SARS Coronavirus 2 NEGATIVE NEGATIVE Final    Comment: (NOTE) SARS-CoV-2 target nucleic acids are NOT DETECTED. The SARS-CoV-2 RNA is generally detectable in upper and lower respiratory specimens during the acute phase of infection. Negative results do not preclude SARS-CoV-2 infection, do not rule  out co-infections with other pathogens, and should not be used as the sole basis for treatment or other patient management decisions. Negative results must be combined with clinical observations, patient history, and epidemiological information. The expected result is Negative. Fact Sheet for Patients: hairslick.no Fact Sheet for Healthcare Providers: quierodirigir.com This test is not yet approved or cleared by the [place]  FDA and  has been authorized for detection and/or diagnosis of SARS-CoV-2 by FDA under an Emergency Use Authorization (EUA). This EUA will remain  in effect (meaning this test can be used) for the duration of the COVID-19 declaration under Section 56 4(b)(1) of the Act, 21 U.S.C. section 360bbb-3(b)(1), unless the authorization is terminated or revoked sooner. Performed at [facility], [address], [place], [place] [address]     Labs: CBC: Recent Labs  Lab 02/23/24 2212  WBC 12.5*  NEUTROABS 8.9*  HGB 12.5*  HCT 35.2*  MCV 96.7  PLT 235   Basic Metabolic Panel: Recent Labs  Lab 02/23/24 2212  NA 133*  K 3.5  CL 98  CO2 22  GLUCOSE 88  BUN 18  CREATININE 1.07  CALCIUM 8.7*   Liver Function Tests: Recent Labs  Lab 02/23/24 2212  AST 22  ALT 19  ALKPHOS 88  BILITOT 0.6  PROT 7.0  ALBUMIN 4.0   CBG: No results for input(s): GLUCAP in the last 168 hours.  Discharge time spent: greater than 30 minutes.  Signed: Cort ONEIDA Mana, MD [facility] 02/24/2024

## 2024-02-24 NOTE — ED Notes (Signed)
 CCMD called to get pt onto cardiac monitoring.
